# Patient Record
Sex: Female | Born: 1935 | Race: White | Hispanic: No | Smoking: Never smoker
Health system: Southern US, Academic
[De-identification: ages and names within clinical notes are randomized; demographics above are authoritative.]

## PROBLEM LIST (undated history)

## (undated) DIAGNOSIS — I509 Heart failure, unspecified: Secondary | ICD-10-CM

## (undated) DIAGNOSIS — I219 Acute myocardial infarction, unspecified: Secondary | ICD-10-CM

## (undated) DIAGNOSIS — K219 Gastro-esophageal reflux disease without esophagitis: Secondary | ICD-10-CM

## (undated) DIAGNOSIS — E039 Hypothyroidism, unspecified: Secondary | ICD-10-CM

## (undated) DIAGNOSIS — R569 Unspecified convulsions: Secondary | ICD-10-CM

## (undated) DIAGNOSIS — I10 Essential (primary) hypertension: Secondary | ICD-10-CM

## (undated) DIAGNOSIS — J309 Allergic rhinitis, unspecified: Secondary | ICD-10-CM

## (undated) HISTORY — PX: HX TAH AND BSO: SHX83

## (undated) HISTORY — PX: HX THYROID BIOPSY: 2101000001

## (undated) HISTORY — PX: HX COLON SURGERY (ANY): 2100001403

## (undated) HISTORY — PX: HX APPENDECTOMY: SHX54

## (undated) HISTORY — PX: HX KNEE REPLACMENT: SHX125

## (undated) HISTORY — PX: HX HYSTERECTOMY: SHX81

## (undated) HISTORY — PX: HX WISDOM TEETH EXTRACTION: SHX21

---

## 2006-09-06 ENCOUNTER — Ambulatory Visit (INDEPENDENT_AMBULATORY_CARE_PROVIDER_SITE_OTHER): Payer: Self-pay | Admitting: Specialist

## 2006-09-13 ENCOUNTER — Ambulatory Visit (INDEPENDENT_AMBULATORY_CARE_PROVIDER_SITE_OTHER): Payer: Medicare Other | Admitting: Neurology

## 2008-05-28 ENCOUNTER — Other Ambulatory Visit (HOSPITAL_COMMUNITY): Payer: Self-pay

## 2008-06-22 ENCOUNTER — Ambulatory Visit
Admission: RE | Admit: 2008-06-22 | Discharge: 2008-06-22 | Disposition: A | Discharge status: Home / Self Care | Payer: Medicare Other

## 2008-06-22 DIAGNOSIS — Z1231 Encounter for screening mammogram for malignant neoplasm of breast: Secondary | ICD-10-CM | POA: Insufficient documentation

## 2011-04-26 ENCOUNTER — Ambulatory Visit (HOSPITAL_COMMUNITY): Payer: Self-pay | Admitting: Family

## 2014-10-24 ENCOUNTER — Encounter (FREE_STANDING_LABORATORY_FACILITY)
Admit: 2014-10-24 | Disposition: A | Discharge status: Home / Self Care | Payer: Self-pay | Source: Other Acute Inpatient Hospital

## 2014-10-25 ENCOUNTER — Other Ambulatory Visit (FREE_STANDING_LABORATORY_FACILITY): Payer: Self-pay

## 2014-10-25 LAB — MANUAL DIFFERENTIAL (CELLAVISION)
BANDS: 1 % (ref 0–15)
BASOPHIL #: 0 x10ˆ3/uL (ref 0.00–0.20)
BASOPHIL %: 0 %
EOSINOPHIL #: 0 x10ˆ3/uL (ref 0.00–0.50)
EOSINOPHIL %: 0 %
LYMPHOCYTE #: 2.81 x10ˆ3/uL (ref 1.00–4.80)
LYMPHOCYTE %: 27 %
METAMYELOCYTES: 1 % (ref 0–2)
MONOCYTE #: 0.52 x10?3/uL (ref 0.30–1.00)
MONOCYTE %: 5 %
NEUTROPHIL #: 6.87 x10ˆ3/uL (ref 1.50–7.70)
NEUTROPHIL %: 66 %

## 2014-10-25 LAB — URINALYSIS, MACROSCOPIC
BILIRUBIN: NEGATIVE mg/dL
BLOOD: NEGATIVE mg/dL
COLOR: NORMAL
GLUCOSE: NEGATIVE mg/dL
KETONES: NEGATIVE mg/dL
LEUKOCYTES: NEGATIVE WBCs/uL
NITRITE: NEGATIVE
PH: 7 (ref 5.0–8.0)
PROTEIN: NEGATIVE mg/dL
SPECIFIC GRAVITY: 1.01 (ref 1.005–1.030)
UROBILINOGEN: NEGATIVE mg/dL

## 2014-10-25 LAB — URINALYSIS, MICROSCOPIC
RBCS: 0 /HPF (ref <6.0)
WBCS: <1 /HPF (ref <11.0)

## 2014-10-25 LAB — CBC/DIFF - CLIENT CONSOLIDATED
BASOPHIL #: 0 x10ˆ3/uL (ref 0.00–0.20)
BASOPHIL %: 0 %
EOSINOPHIL #: 0 x10ˆ3/uL (ref 0.00–0.50)
EOSINOPHIL %: 0 %
HCT: 27.7 % — ABNORMAL LOW (ref 33.5–45.2)
HGB: 9.5 g/dL — ABNORMAL LOW (ref 11.2–15.2)
LYMPHOCYTE #: 2.81 x10ˆ3/uL (ref 1.00–4.80)
LYMPHOCYTE %: 27 %
MCH: 32.7 pg (ref 27.4–33.0)
MCHC: 34.4 g/dL (ref 32.5–35.8)
MCV: 95.1 fL (ref 78.0–100.0)
MONOCYTE #: 0.52 x10ˆ3/uL (ref 0.30–1.00)
MONOCYTE %: 5 %
MPV: 7.7 fL (ref 7.5–11.5)
NEUTROPHIL #: 6.87 x10ˆ3/uL (ref 1.50–7.70)
NEUTROPHIL %: 66 %
PLATELETS: 213 x10ˆ3/uL (ref 140–450)
RBC: 2.92 x10ˆ6/uL — ABNORMAL LOW (ref 3.63–4.92)
RDW: 13.3 % (ref 12.0–15.0)
WBC: 10.3 x10ˆ3/uL
WBC: 10.3 x10ˆ3/uL (ref 3.5–11.0)

## 2014-10-25 LAB — CBC WITH DIFF
HCT: 27.7 % — ABNORMAL LOW (ref 33.5–45.2)
HGB: 9.5 g/dL — ABNORMAL LOW (ref 11.2–15.2)
MCH: 32.7 pg (ref 27.4–33.0)
MCHC: 34.4 g/dL (ref 32.5–35.8)
MCV: 95.1 fL (ref 78.0–100.0)
MPV: 7.7 fL (ref 7.5–11.5)
PLATELETS: 213 x10ˆ3/uL (ref 140–450)
RBC: 2.92 x10ˆ6/uL — ABNORMAL LOW (ref 3.63–4.92)
RDW: 13.3 % (ref 12.0–15.0)
WBC: 10.3 x10ˆ3/uL (ref 3.5–11.0)

## 2014-10-25 LAB — COMPREHENSIVE METABOLIC PANEL, NON-FASTING
ALBUMIN: 2.7 g/dL — ABNORMAL LOW (ref 3.4–4.8)
ALKALINE PHOSPHATASE: 130 U/L (ref <150)
ALT (SGPT): 385 U/L — ABNORMAL HIGH (ref <55)
ANION GAP: 8 mmol/L (ref 4–13)
AST (SGOT): 253 U/L — ABNORMAL HIGH (ref 8–41)
BILIRUBIN TOTAL: 1.4 mg/dL — ABNORMAL HIGH (ref 0.3–1.3)
BUN/CREA RATIO: 22 (ref 6–22)
BUN: 21 mg/dL (ref 8–25)
CALCIUM: 8.8 mg/dL (ref 8.5–10.4)
CHLORIDE: 98 mmol/L (ref 96–111)
CO2 TOTAL: 29 mmol/L (ref 22–32)
CREATININE: 0.97 mg/dL (ref 0.49–1.10)
ESTIMATED GFR: 59 mL/min/1.73mˆ2 — ABNORMAL LOW (ref >59)
GLUCOSE: 112 mg/dL (ref 65–139)
POTASSIUM: 4.4 mmol/L (ref 3.5–5.1)
PROTEIN TOTAL: 5.5 g/dL — ABNORMAL LOW (ref 6.0–8.0)
SODIUM: 135 mmol/L — ABNORMAL LOW (ref 136–145)

## 2014-10-26 LAB — URINALYSIS, MACROSCOPIC AND MICROSCOPIC W/CULTURE REFLEX - CLIENT CONSOLIDATED
BILIRUBIN: NEGATIVE mg/dL
BLOOD: NEGATIVE mg/dL
COLOR: NORMAL
GLUCOSE: NEGATIVE mg/dL
KETONES: NEGATIVE mg/dL
LEUKOCYTES: NEGATIVE WBCs/uL
NITRITE: NEGATIVE
PH: 7 (ref 5.0–8.0)
PROTEIN: NEGATIVE mg/dL
RBCS: 0 /HPF (ref <6.0)
SPECIFIC GRAVITY: 1.01 (ref 1.005–1.030)
UROBILINOGEN: NEGATIVE mg/dL
WBCS: <1 /HPF (ref <11.0)

## 2014-10-28 ENCOUNTER — Other Ambulatory Visit (FREE_STANDING_LABORATORY_FACILITY): Payer: Self-pay

## 2014-10-28 LAB — CBC/DIFF - CLIENT CONSOLIDATED
BASOPHIL #: 0 x10?3/uL (ref 0.00–0.20)
BASOPHIL %: 0 %
EOSINOPHIL #: 0.14 x10ˆ3/uL (ref 0.00–0.50)
EOSINOPHIL %: 1 %
HCT: 28.6 % — ABNORMAL LOW (ref 33.5–45.2)
HGB: 9.8 g/dL — ABNORMAL LOW (ref 11.2–15.2)
LYMPHOCYTE #: 1.93 x10?3/uL (ref 1.00–4.80)
LYMPHOCYTE %: 14 %
MCH: 33.2 pg — ABNORMAL HIGH (ref 27.4–33.0)
MCHC: 34.2 g/dL (ref 32.5–35.8)
MCV: 97.3 fL (ref 78.0–100.0)
MONOCYTE #: 1.1 x10ˆ3/uL — ABNORMAL HIGH (ref 0.30–1.00)
MONOCYTE %: 8 %
MPV: 7.7 fL (ref 7.5–11.5)
NEUTROPHIL #: 10.49 x10ˆ3/uL — ABNORMAL HIGH (ref 1.50–7.70)
NEUTROPHIL %: 76 %
PLATELETS: 254 x10ˆ3/uL (ref 140–450)
RBC: 2.93 x10ˆ6/uL — ABNORMAL LOW (ref 3.63–4.92)
RDW: 13.8 % (ref 12.0–15.0)
WBC: 13.8 x10ˆ3/uL
WBC: 13.8 x10ˆ3/uL — ABNORMAL HIGH (ref 3.5–11.0)

## 2014-10-28 LAB — CBC WITH DIFF
HCT: 28.6 % — ABNORMAL LOW (ref 33.5–45.2)
HGB: 9.8 g/dL — ABNORMAL LOW (ref 11.2–15.2)
MCH: 33.2 pg — ABNORMAL HIGH (ref 27.4–33.0)
MCHC: 34.2 g/dL (ref 32.5–35.8)
MCV: 97.3 fL (ref 78.0–100.0)
MPV: 7.7 fL (ref 7.5–11.5)
PLATELETS: 254 x10ˆ3/uL (ref 140–450)
RBC: 2.93 x10ˆ6/uL — ABNORMAL LOW (ref 3.63–4.92)
RDW: 13.8 % (ref 12.0–15.0)
WBC: 13.8 x10ˆ3/uL — ABNORMAL HIGH (ref 3.5–11.0)

## 2014-10-28 LAB — MANUAL DIFFERENTIAL (CELLAVISION)
BASOPHIL #: 0 x10ˆ3/uL (ref 0.00–0.20)
BASOPHIL %: 0 %
EOSINOPHIL #: 0.14 x10ˆ3/uL (ref 0.00–0.50)
EOSINOPHIL %: 1 %
LYMPHOCYTE #: 1.93 x10ˆ3/uL (ref 1.00–4.80)
LYMPHOCYTE %: 14 %
METAMYELOCYTES: 1 % (ref 0–2)
MONOCYTE #: 1.1 x10ˆ3/uL — ABNORMAL HIGH (ref 0.30–1.00)
MONOCYTE %: 8 %
NEUTROPHIL #: 10.49 x10ˆ3/uL — ABNORMAL HIGH (ref 1.50–7.70)
NEUTROPHIL %: 76 %
NRBC # AUTOMATED: 1 x10ˆ3/uL

## 2014-10-28 LAB — BASIC METABOLIC PANEL
ANION GAP: 13 mmol/L (ref 4–13)
BUN/CREA RATIO: 22 (ref 6–22)
BUN: 26 mg/dL — ABNORMAL HIGH (ref 8–25)
CALCIUM: 8.7 mg/dL (ref 8.5–10.4)
CHLORIDE: 102 mmol/L (ref 96–111)
CO2 TOTAL: 23 mmol/L (ref 22–32)
CREATININE: 1.18 mg/dL — ABNORMAL HIGH (ref 0.49–1.10)
ESTIMATED GFR: 47 mL/min/1.73mˆ2 — ABNORMAL LOW (ref >59)
GLUCOSE: 109 mg/dL (ref 65–139)
POTASSIUM: 4 mmol/L (ref 3.5–5.1)
SODIUM: 138 mmol/L (ref 136–145)

## 2014-10-29 ENCOUNTER — Other Ambulatory Visit (FREE_STANDING_LABORATORY_FACILITY): Payer: Self-pay | Admitting: PHYSICIAN ASSISTANT

## 2014-10-30 LAB — URINALYSIS, MACROSCOPIC
BILIRUBIN: NEGATIVE mg/dL
COLOR: NORMAL
GLUCOSE: NEGATIVE mg/dL
KETONES: NEGATIVE mg/dL
NITRITE: POSITIVE — AB
PH: 5 (ref 5.0–8.0)
PROTEIN: 30 mg/dL — AB
SPECIFIC GRAVITY: 1.02 (ref 1.005–1.030)
UROBILINOGEN: NEGATIVE mg/dL

## 2014-10-30 LAB — URINALYSIS, MACROSCOPIC AND MICROSCOPIC W/CULTURE REFLEX - CLIENT CONSOLIDATED
BILIRUBIN: NEGATIVE mg/dL
COLOR: NORMAL
KETONES: NEGATIVE mg/dL
NITRITE: POSITIVE — AB
PH: 5 (ref 5.0–8.0)
PROTEIN: 30 mg/dL — AB
RBCS: 26 /HPF — ABNORMAL HIGH (ref <6.0)
SPECIFIC GRAVITY: 1.02 (ref 1.005–1.030)
UROBILINOGEN: NEGATIVE mg/dL
WBCS: >182 /HPF — ABNORMAL HIGH (ref <11.0)

## 2014-10-30 LAB — URINALYSIS, MICROSCOPIC
RBCS: 26 /HPF — ABNORMAL HIGH (ref <6.0)
WBCS: >182 /HPF — ABNORMAL HIGH (ref <11.0)

## 2014-10-31 ENCOUNTER — Other Ambulatory Visit (FREE_STANDING_LABORATORY_FACILITY): Payer: Self-pay | Admitting: PHYSICIAN ASSISTANT

## 2014-10-31 LAB — CBC
HCT: 30.8 % — ABNORMAL LOW (ref 33.5–45.2)
HGB: 10.4 g/dL — ABNORMAL LOW (ref 11.2–15.2)
MCH: 33.3 pg — ABNORMAL HIGH (ref 27.4–33.0)
MCHC: 33.9 g/dL (ref 32.5–35.8)
MCV: 98.1 fL (ref 78.0–100.0)
MPV: 7.6 fL (ref 7.5–11.5)
PLATELETS: 334 x10ˆ3/uL (ref 140–450)
RBC: 3.14 x10ˆ6/uL — ABNORMAL LOW (ref 3.63–4.92)
RDW: 14.7 % (ref 12.0–15.0)
WBC: 9.8 x10ˆ3/uL (ref 3.5–11.0)

## 2014-10-31 LAB — BASIC METABOLIC PANEL
ANION GAP: 13 mmol/L (ref 4–13)
BUN/CREA RATIO: 22 (ref 6–22)
BUN: 22 mg/dL (ref 8–25)
CALCIUM: 8.9 mg/dL (ref 8.5–10.4)
CHLORIDE: 104 mmol/L (ref 96–111)
CO2 TOTAL: 22 mmol/L (ref 22–32)
CREATININE: 1.02 mg/dL (ref 0.49–1.10)
ESTIMATED GFR: 56 mL/min/1.73mˆ2 — ABNORMAL LOW (ref >59)
GLUCOSE: 95 mg/dL (ref 65–139)
POTASSIUM: 4 mmol/L (ref 3.5–5.1)
SODIUM: 139 mmol/L (ref 136–145)

## 2014-11-01 LAB — URINE CULTURE: URINE CULTURE: >100000 — AB

## 2014-11-18 ENCOUNTER — Encounter (FREE_STANDING_LABORATORY_FACILITY)
Admit: 2014-11-18 | Discharge: 2014-11-18 | Disposition: A | Discharge status: Home / Self Care | Payer: Self-pay | Attending: Family | Admitting: Family

## 2014-11-18 ENCOUNTER — Other Ambulatory Visit (FREE_STANDING_LABORATORY_FACILITY): Payer: Self-pay | Admitting: Family

## 2014-11-19 LAB — URINE CULTURE: URINE CULTURE: <10000 — AB

## 2015-02-12 ENCOUNTER — Other Ambulatory Visit (HOSPITAL_COMMUNITY): Payer: Self-pay | Admitting: Family

## 2015-02-12 DIAGNOSIS — I509 Heart failure, unspecified: Secondary | ICD-10-CM

## 2015-02-24 ENCOUNTER — Other Ambulatory Visit (INDEPENDENT_AMBULATORY_CARE_PROVIDER_SITE_OTHER): Payer: Self-pay | Admitting: Internal Medicine

## 2015-02-24 ENCOUNTER — Encounter (INDEPENDENT_AMBULATORY_CARE_PROVIDER_SITE_OTHER): Payer: Self-pay | Admitting: Internal Medicine

## 2015-02-24 ENCOUNTER — Ambulatory Visit (INDEPENDENT_AMBULATORY_CARE_PROVIDER_SITE_OTHER): Payer: MEDICARE | Admitting: Internal Medicine

## 2015-02-24 DIAGNOSIS — R079 Chest pain, unspecified: Secondary | ICD-10-CM

## 2015-02-24 DIAGNOSIS — I509 Heart failure, unspecified: Secondary | ICD-10-CM

## 2015-02-24 MED ORDER — METOPROLOL SUCCINATE ER 50 MG TABLET,EXTENDED RELEASE 24 HR
50.0000 mg | ORAL_TABLET | Freq: Every day | ORAL | 4 refills | Status: DC
Start: 2015-02-24 — End: 2015-03-12

## 2015-02-24 MED ORDER — SPIRONOLACTONE 25 MG TABLET
25.0000 mg | ORAL_TABLET | Freq: Every day | ORAL | 3 refills | Status: DC
Start: 2015-02-24 — End: 2015-03-04

## 2015-02-24 NOTE — H&P (Signed)
[facility] AND [facility]                              DEPARTMENT OF MEDICINE                                [place], [place] [address]                                PATIENT NAME: [name], [name]  HOSPITAL UEAVWU:[id number]  DATE OF SERVICE:02/24/2015  DATE OF BIRTH: April 21, 1933    HISTORY AND PHYSICAL    PRIMARY CARE PROVIDER:  Lamont Snowball, NP    REASON FOR VISIT:  Congestive heart failure.    HISTORY OF PRESENT ILLNESS:  [name] is an 82 year old female without known coronary artery disease who was recently diagnosed with congestive heart failure.  This was based upon a recent transthoracic echocardiogram from February 06, 2015, in which she was found to have global hypokinesis of her left ventricle with estimated ejection fraction of 25% with wall motion abnormalities involving the distal anterior septal, apical and lateral walls.  She reports for approximately the last 2-3 months she has been experiencing worsening dyspnea, particularly with exertion.  She describes shortness of breath, both in conversation and with light exertion such as walking around her house.  Her family has noted heavy breathing at times as when talking to her over the phone, suggestive of New York Heart Association class III-IV symptomatology.  She denies any definite fluid overloaded at this time, however, continues to be short of breath.  She denies any definite chest pain, arm pain, neck pain, or back pain.  She reports no loss of consciousness, syncope or near syncope.  She has no known history of coronary artery disease, but her risk factors include hypertension, hyperlipidemia and a strong family history of coronary artery disease.    MEDICATIONS:  1.  Allopurinol 100 mg daily.  2.  Aspirin 81 mg daily.  3.  Synthroid 50 mcg daily.  4.  Lisinopril 10 mg daily.  5.  Toprol-XL 50 mg daily (this was initiated today).  6.  Aldactone 25 mg daily (this was initiated today).    PAST MEDICAL HISTORY:  1.   Hypertension.  2.  Hyperlipidemia.  3.  Psoriasis.  4.  Early dementia.  5.  History of diverticulitis, status post colon resection.    PAST SURGICAL HISTORY:  She reports a history of parathyroid surgery, left knee surgery, colon resection secondary to diverticulitis, and hysterectomy.    SOCIAL HISTORY:  She is a nonsmoker, does not drink alcohol.  She is a retired former Scientist, research (physical sciences).  She has 1 healthy child.    FAMILY HISTORY:  She reports a family history of coronary artery disease in both her mother and father in their 68s.    REVIEW OF SYSTEMS:  Other than the aforementioned shortness of breath with exertion, she reports negative to all other review of systems other than trouble sleeping at night.    PHYSICAL EXAMINATION:  On exam, she is 5 feet 2 inches tall, she weighs 155 pounds, heart rate 81 beats per minute, blood pressure 157/85, and oxygen saturation 97% on room air.  She is in no acute distress.  Head is normocephalic, atraumatic.  Neck is supple without JVD, carotid bruit or thyromegaly.  Heart is regular rate and rhythm without murmurs, gallops or rubs.  Lungs:  Clear to auscultation bilaterally.  Abdomen:  Soft, nontender, nondistended.  Extremities reveal no peripheral edema.  She is alert and oriented x3.  Skin is warm and dry without rash.    LABORATORY DATA AND TESTS:  An electrocardiogram performed today in clinic revealed a normal sinus rhythm with a left bundle branch block.  This left bundle branch block dates back to least September 2016 when she had a previous EKG at an outside facility and thus is old.  She had a carotid artery ultrasound study done in January 2017 which revealed no evidence of hemodynamically significant stenosis in the carotid arteries, with mild bilateral atherosclerotic plaque formation.    ASSESSMENT AND PLAN:  [name] an 82 year old female who presents for evaluation of congestive heart failure.  1.  Systolic congestive heart failure.  She clearly has New  [place] Heart Association class III-IV symptomatology.  At this time, we recommend further evaluation with a right and left cardiac catheterization to be performed at [facility] in the near future, following insurance authorization.  On physical exam, she appears to be clinically euvolemic.  We will initiate Toprol-XL 50 mg daily as well as Spironolactone 25 mg daily.  She will also continue lisinopril 10 mg daily.  We will follow up with her approximately 4 weeks after her cardiac catheterization. If tolerating, this regimen, post-cath we will likely recommend initiation of Entresto with discontinuation of Lisinopril.  2.  Hypertension.  Blood pressure is elevated.  We have added Aldactone and Toprol-XL to her regimen and will follow up with her.  3.  The patient was given a lab slip for baseline labs to be checked approximately 7-10 days after initiation of Aldactone as well as part of the preoperative workup for her cardiac catheterization.  4.  Follow up in 4-6 weeks after cardiac catheterization.      Earl Lites, MD  Assistant Professor   [facility] Department of Medicine, Section of Cardiology    ZO/[id number]; D: 02/24/2015 12:15:34; T: 02/24/2015 14:31:55    cc: Lamont Snowball NP      [address] [place], [place] [address]

## 2015-02-24 NOTE — Progress Notes (Signed)
See dictated note.  JL

## 2015-02-27 ENCOUNTER — Telehealth (HOSPITAL_BASED_OUTPATIENT_CLINIC_OR_DEPARTMENT_OTHER): Payer: Self-pay | Admitting: Internal Medicine

## 2015-02-27 NOTE — Telephone Encounter (Signed)
Received order for R/L heart cath with Dr. Cleone Slim. Spoke with pt's niece Mickeal Skinner, scheduled procedure, and gave her instructions. Pt to report to first floor registration at [facility] 03/04/15 at 7am, NPO after MN, May take am meds with sip of water, and she will need someone with her to drive her home. Phoebe verbalizes understanding of all instructions given and denies any questions or concerns at this time.  Levan Hurst RN

## 2015-03-04 ENCOUNTER — Ambulatory Visit (HOSPITAL_COMMUNITY): Payer: Medicare Other

## 2015-03-04 ENCOUNTER — Inpatient Hospital Stay
Admission: RE | Admit: 2015-03-04 | Discharge: 2015-03-05 | DRG: 247 | Disposition: A | Discharge status: Home / Self Care | Payer: Medicare Other | Source: Ambulatory Visit | Attending: Cardiovascular Disease | Admitting: Cardiovascular Disease

## 2015-03-04 ENCOUNTER — Encounter (HOSPITAL_COMMUNITY)
Admission: RE | Disposition: A | Discharge status: Home / Self Care | Payer: Self-pay | Source: Ambulatory Visit | Attending: Cardiovascular Disease

## 2015-03-04 ENCOUNTER — Encounter (HOSPITAL_COMMUNITY): Payer: Self-pay

## 2015-03-04 ENCOUNTER — Inpatient Hospital Stay (HOSPITAL_COMMUNITY): Payer: Medicare Other | Admitting: Cardiovascular Disease

## 2015-03-04 ENCOUNTER — Encounter (INDEPENDENT_AMBULATORY_CARE_PROVIDER_SITE_OTHER): Payer: Self-pay | Admitting: Internal Medicine

## 2015-03-04 DIAGNOSIS — I110 Hypertensive heart disease with heart failure: Principal | ICD-10-CM | POA: Diagnosis present

## 2015-03-04 DIAGNOSIS — I255 Ischemic cardiomyopathy: Secondary | ICD-10-CM | POA: Diagnosis present

## 2015-03-04 DIAGNOSIS — L271 Localized skin eruption due to drugs and medicaments taken internally: Secondary | ICD-10-CM | POA: Diagnosis present

## 2015-03-04 DIAGNOSIS — E039 Hypothyroidism, unspecified: Secondary | ICD-10-CM | POA: Diagnosis present

## 2015-03-04 DIAGNOSIS — Z7982 Long term (current) use of aspirin: Secondary | ICD-10-CM

## 2015-03-04 DIAGNOSIS — L409 Psoriasis, unspecified: Secondary | ICD-10-CM | POA: Diagnosis present

## 2015-03-04 DIAGNOSIS — T500X5A Adverse effect of mineralocorticoids and their antagonists, initial encounter: Secondary | ICD-10-CM | POA: Diagnosis present

## 2015-03-04 DIAGNOSIS — I5022 Chronic systolic (congestive) heart failure: Secondary | ICD-10-CM | POA: Diagnosis present

## 2015-03-04 DIAGNOSIS — I2510 Atherosclerotic heart disease of native coronary artery without angina pectoris: Secondary | ICD-10-CM | POA: Diagnosis present

## 2015-03-04 DIAGNOSIS — I361 Nonrheumatic tricuspid (valve) insufficiency: Secondary | ICD-10-CM

## 2015-03-04 DIAGNOSIS — I509 Heart failure, unspecified: Secondary | ICD-10-CM

## 2015-03-04 DIAGNOSIS — Z23 Encounter for immunization: Secondary | ICD-10-CM

## 2015-03-04 DIAGNOSIS — H9190 Unspecified hearing loss, unspecified ear: Secondary | ICD-10-CM | POA: Diagnosis present

## 2015-03-04 DIAGNOSIS — Z8249 Family history of ischemic heart disease and other diseases of the circulatory system: Secondary | ICD-10-CM

## 2015-03-04 DIAGNOSIS — F0390 Unspecified dementia without behavioral disturbance: Secondary | ICD-10-CM | POA: Diagnosis present

## 2015-03-04 DIAGNOSIS — E785 Hyperlipidemia, unspecified: Secondary | ICD-10-CM | POA: Diagnosis present

## 2015-03-04 DIAGNOSIS — I447 Left bundle-branch block, unspecified: Secondary | ICD-10-CM | POA: Diagnosis present

## 2015-03-04 DIAGNOSIS — Z79899 Other long term (current) drug therapy: Secondary | ICD-10-CM

## 2015-03-04 HISTORY — DX: Hypothyroidism, unspecified: E03.9

## 2015-03-04 HISTORY — DX: Unspecified convulsions (CMS HCC): R56.9

## 2015-03-04 HISTORY — DX: Essential (primary) hypertension: I10

## 2015-03-04 HISTORY — DX: Allergic rhinitis, unspecified: J30.9

## 2015-03-04 HISTORY — DX: Heart failure, unspecified (CMS HCC): I50.9

## 2015-03-04 HISTORY — DX: Gastro-esophageal reflux disease without esophagitis: K21.9

## 2015-03-04 HISTORY — DX: Acute myocardial infarction, unspecified (CMS HCC): I21.9

## 2015-03-04 LAB — BASIC METABOLIC PANEL
ANION GAP: 11 mmol/L (ref 4–13)
BUN/CREA RATIO: 21 (ref 6–22)
BUN: 25
BUN: 25 mg/dL (ref 8–25)
CALCIUM: 9.8
CALCIUM: 9.8 mg/dL (ref 8.5–10.4)
CARBON DIOXIDE: 25
CHLORIDE: 103
CHLORIDE: 104 mmol/L (ref 96–111)
CO2 TOTAL: 23 mmol/L (ref 22–32)
CREATININE: 1
CREATININE: 1.18 mg/dL — ABNORMAL HIGH (ref 0.49–1.10)
ESTIMATED GFR: 47 mL/min/1.73mˆ2 — ABNORMAL LOW (ref >59)
ESTIMATED GLOMERULAR FILTRATION RATE: 53
GLUCOSE,NONFAST: 84
GLUCOSE: 104 mg/dL (ref 65–139)
POTASSIUM: 4.4 mmol/L (ref 3.5–5.1)
POTASSIUM: 4.6
SODIUM: 138 mmol/L (ref 136–145)
SODIUM: 143

## 2015-03-04 LAB — CBC WITH DIFF
ABSOLUTE LYMPHOCYTE COUNT: 2.7
ABSOLUTE NEUTROPHIL COUNT: 6.6
BASOPHIL #: 0.13 x10ˆ3/uL (ref 0.00–0.20)
BASOPHIL %: 1 %
BASOPHIL: 0.7
EOSINOPHIL #: 0.45 x10ˆ3/uL (ref 0.00–0.50)
EOSINOPHIL %: 4 %
EOSINOPHIL: 3.1
HCT: 38.4
HCT: 40.6 % (ref 33.5–45.2)
HGB: 13.7
HGB: 14 g/dL (ref 11.2–15.2)
LYMPHOCYTE #: 2.98 x10ˆ3/uL (ref 1.00–4.80)
LYMPHOCYTE #: 25.8 x10ˆ3/uL
LYMPHOCYTE %: 27 %
MCH: 31.5 pg (ref 27.4–33.0)
MCH: 32
MCHC: 34.5 g/dL (ref 32.5–35.8)
MCHC: 36
MCV: 89
MCV: 91.3 fL (ref 78.0–100.0)
MONOCYTE #: 0.93 x10ˆ3/uL (ref 0.30–1.00)
MONOCYTE %: 9 %
MPV: 7.3 fL — ABNORMAL LOW (ref 7.5–11.5)
MPV: 9
NEUTROPHIL #: 6.48 x10ˆ3/uL (ref 1.50–7.70)
NEUTROPHIL %: 59 %
NEUTROPHILS MANUAL: 63
PLATELET COUNT: 263
PLATELETS: 245 x10ˆ3/uL (ref 140–450)
RBC: 4.45 x10ˆ6/uL (ref 3.63–4.92)
RDW: 13.6
RDW: 13.6
RDW: 14.6 % (ref 12.0–15.0)
WBC: 11 x10ˆ3/uL (ref 3.5–11.0)

## 2015-03-04 LAB — POCT ISTAT G3 BLOOD GAS (ARTERIAL - RESULTS)
BASE EXCESS (BEP): -2 mmol/L (ref ?–2.0)
BASE EXCESS (BEP): 0 mmol/L (ref ?–2.0)
HCO3 (HCO3P): 23 mmol/L (ref 22–26)
HCO3 (HCO3P): 25 mmol/L (ref 22–26)
PCO2 (PCO2P): 38 mmHg (ref 35–45)
PCO2 (PCO2P): 43 mmHg (ref 35–45)
PH (PHP): 7.37 (ref 7.35–7.45)
PH (PHP): 7.38 (ref 7.35–7.45)
PO2 (PO2P): 35 mmHg — CL (ref 72–100)
PO2 (PO2P): 67 mmHg — ABNORMAL LOW (ref 72–100)
SO2 (SO2P): 66 % — CL (ref >=80)
SO2 (SO2P): 93 % (ref >=80)
TCO2 (TOC2P): 24 mmol/L (ref 22–32)
TCO2 (TOC2P): 26 mmol/L (ref 22–32)

## 2015-03-04 LAB — POC ACT CELITE (RESULTS)
ACT CELITE, POC: 181 s — ABNORMAL HIGH
ACT CELITE, POC: 248 s — ABNORMAL HIGH
ACT CELITE, POC: 253 s — ABNORMAL HIGH
ACT CELITE, POC: 299 s — ABNORMAL HIGH
ACT CELITE, POC: 275 s — ABNORMAL HIGH
ACT CELITE, POC: 275 s — ABNORMAL HIGH
ACT CELITE, POC: 233 s — ABNORMAL HIGH
ACT CELITE, POC: 214 s — ABNORMAL HIGH
ACT CELITE, POC: 185 s — ABNORMAL HIGH

## 2015-03-04 LAB — TYPE AND SCREEN
ABO/RH(D): B POS
ANTIBODY SCREEN: NEGATIVE

## 2015-03-04 LAB — PT/INR
INR: 0.91 (ref 0.80–1.20)
PROTHROMBIN TIME: 10.3 s (ref 9.0–13.6)

## 2015-03-04 LAB — H & H
HCT: 37.5 % (ref 33.5–45.2)
HGB: 13 g/dL (ref 11.2–15.2)

## 2015-03-04 LAB — PTT (PARTIAL THROMBOPLASTIN TIME): APTT: 25.3 s (ref 25.1–36.5)

## 2015-03-04 SURGERY — R/L HEART CATH W/CORONARY ANGIO W/WO LVG
Laterality: Right

## 2015-03-04 MED ORDER — HEPARIN (PORCINE) 1,000 UNIT/ML INJECTION SOLUTION
Freq: Once | INTRAMUSCULAR | Status: DC | PRN
Start: 2015-03-04 — End: 2015-03-04
  Administered 2015-03-04: 2000 [IU] via INTRAVENOUS
  Administered 2015-03-04: 3000 [IU] via INTRAVENOUS
  Administered 2015-03-04: 2000 [IU] via INTRAVENOUS
  Administered 2015-03-04: 3600 [IU] via INTRAVENOUS
  Administered 2015-03-04: 2500 [IU] via INTRAVENOUS
  Administered 2015-03-04: 2000 [IU] via INTRAVENOUS

## 2015-03-04 MED ORDER — IOPAMIDOL 250 MG IODINE/ML (51 %) INTRAVENOUS SOLUTION
Freq: Once | INTRAVENOUS | Status: DC | PRN
Start: 2015-03-04 — End: 2015-03-04
  Administered 2015-03-04: 200 mL via INTRA_ARTERIAL

## 2015-03-04 MED ORDER — LIDOCAINE HCL 20 MG/ML (2 %) INJECTION SOLUTION
INTRAMUSCULAR | Status: AC
Start: 2015-03-04 — End: 2015-03-04
  Filled 2015-03-04: qty 20

## 2015-03-04 MED ORDER — PERFLUTREN LIPID MICROSPHERES 1.1 MG/ML INTRAVENOUS SUSPENSION
0.30 mL | INTRAVENOUS | Status: DC
Start: 2015-03-04 — End: 2015-03-05

## 2015-03-04 MED ORDER — LISINOPRIL 10 MG TABLET
10.0000 mg | ORAL_TABLET | Freq: Every day | ORAL | Status: DC
Start: 2015-03-05 — End: 2015-03-05
  Administered 2015-03-05: 10 mg via ORAL
  Filled 2015-03-04: qty 1

## 2015-03-04 MED ORDER — HEPARIN (PORCINE) 5,000 UNIT/ML INJECTION SOLUTION
5000.0000 [IU] | Freq: Three times a day (TID) | INTRAMUSCULAR | Status: DC
Start: 2015-03-04 — End: 2015-03-05
  Administered 2015-03-04 (×2): 0 [IU] via SUBCUTANEOUS
  Administered 2015-03-05: 5000 [IU] via SUBCUTANEOUS
  Filled 2015-03-04 (×6): qty 1

## 2015-03-04 MED ORDER — HEPARIN (PORCINE) 1,000 UNIT/ML INJECTION SOLUTION
INTRAMUSCULAR | Status: AC
Start: 2015-03-04 — End: 2015-03-04
  Filled 2015-03-04: qty 10

## 2015-03-04 MED ORDER — VERAPAMIL 2.5 MG/ML INTRAVENOUS SOLUTION
Freq: Once | INTRAVENOUS | Status: DC | PRN
Start: 2015-03-04 — End: 2015-03-04
  Administered 2015-03-04: 10:00:00 5 mg via INTRA_ARTERIAL

## 2015-03-04 MED ORDER — NITROGLYCERIN 12.5 MG IN NS 250 ML
Freq: Once | Status: DC | PRN
Start: 2015-03-04 — End: 2015-03-04
  Administered 2015-03-04: 200 ug via INTRA_ARTERIAL

## 2015-03-04 MED ORDER — MIDAZOLAM 1 MG/ML INJECTION SOLUTION
INTRAMUSCULAR | Status: AC
Start: 2015-03-04 — End: 2015-03-04
  Filled 2015-03-04: qty 4

## 2015-03-04 MED ORDER — VERAPAMIL 2.5 MG/ML INTRAVENOUS SOLUTION
INTRAVENOUS | Status: AC
Start: 2015-03-04 — End: 2015-03-04
  Filled 2015-03-04: qty 2

## 2015-03-04 MED ORDER — CLOPIDOGREL 300 MG TABLET
ORAL_TABLET | ORAL | Status: AC
Start: 2015-03-04 — End: 2015-03-04
  Filled 2015-03-04: qty 2

## 2015-03-04 MED ORDER — LIDOCAINE HCL 20 MG/ML (2 %) INJECTION SOLUTION
Freq: Once | INTRAMUSCULAR | Status: DC | PRN
Start: 2015-03-04 — End: 2015-03-04
  Administered 2015-03-04 (×2): 5 mL via INTRADERMAL

## 2015-03-04 MED ORDER — MIDAZOLAM 1 MG/ML INJECTION SOLUTION
Freq: Once | INTRAMUSCULAR | Status: DC | PRN
Start: 2015-03-04 — End: 2015-03-04
  Administered 2015-03-04 (×6): 1 mg via INTRAVENOUS

## 2015-03-04 MED ORDER — METOPROLOL SUCCINATE ER 50 MG TABLET,EXTENDED RELEASE 24 HR
50.0000 mg | ORAL_TABLET | Freq: Every day | ORAL | Status: DC
Start: 2015-03-05 — End: 2015-03-05
  Administered 2015-03-05: 50 mg via ORAL
  Filled 2015-03-04: qty 1

## 2015-03-04 MED ORDER — FENTANYL (PF) 50 MCG/ML INJECTION SOLUTION
INTRAMUSCULAR | Status: AC
Start: 2015-03-04 — End: 2015-03-04
  Filled 2015-03-04: qty 2

## 2015-03-04 MED ORDER — FENTANYL (PF) 50 MCG/ML INJECTION SOLUTION
Freq: Once | INTRAMUSCULAR | Status: DC | PRN
Start: 2015-03-04 — End: 2015-03-04
  Administered 2015-03-04 (×4): 25 ug via INTRAVENOUS

## 2015-03-04 MED ORDER — LEVOTHYROXINE 50 MCG TABLET
50.00 ug | ORAL_TABLET | Freq: Every day | ORAL | Status: DC
Start: 2015-03-05 — End: 2015-03-05
  Administered 2015-03-05: 50 ug via ORAL
  Filled 2015-03-04 (×2): qty 1

## 2015-03-04 MED ORDER — ALLOPURINOL 100 MG TABLET
100.0000 mg | ORAL_TABLET | Freq: Every day | ORAL | Status: DC
Start: 2015-03-04 — End: 2015-03-05
  Administered 2015-03-04: 0 mg via ORAL
  Administered 2015-03-05: 100 mg via ORAL
  Filled 2015-03-04 (×2): qty 1

## 2015-03-04 MED ORDER — ASPIRIN 81 MG TABLET,DELAYED RELEASE
81.0000 mg | DELAYED_RELEASE_TABLET | Freq: Every day | ORAL | Status: DC
Start: 2015-03-05 — End: 2015-03-05
  Administered 2015-03-05: 81 mg via ORAL
  Filled 2015-03-04: qty 1

## 2015-03-04 MED ORDER — PNEUMOCOCCAL 13-VAL CONJ VACCINE-DIP CRM (PF) 0.5 ML IM SYRINGE
0.5000 mL | INJECTION | Freq: Once | INTRAMUSCULAR | Status: AC
Start: 2015-03-04 — End: 2015-03-04
  Administered 2015-03-04: 0.5 mL via INTRAMUSCULAR
  Filled 2015-03-04: qty 0.5

## 2015-03-04 MED ORDER — MIDAZOLAM 1 MG/ML INJECTION SOLUTION
INTRAMUSCULAR | Status: AC
Start: 2015-03-04 — End: 2015-03-04
  Filled 2015-03-04: qty 2

## 2015-03-04 MED ORDER — ATORVASTATIN 80 MG TABLET
80.00 mg | ORAL_TABLET | Freq: Every evening | ORAL | Status: DC
Start: 2015-03-04 — End: 2015-03-05
  Administered 2015-03-04: 0 mg via ORAL
  Filled 2015-03-04 (×2): qty 1

## 2015-03-04 MED ORDER — SODIUM CHLORIDE 0.9 % INTRAVENOUS SOLUTION
INTRAVENOUS | Status: DC
Start: 2015-03-04 — End: 2015-03-05

## 2015-03-04 MED ORDER — CLOPIDOGREL 300 MG TABLET
ORAL_TABLET | Freq: Once | ORAL | Status: DC | PRN
Start: 2015-03-04 — End: 2015-03-04
  Administered 2015-03-04: 600 mg via ORAL

## 2015-03-04 MED ADMIN — sodium chloride 0.9 % intravenous solution: INTRA_ARTERIAL | @ 12:00:00 | NDC 00338004904

## 2015-03-04 MED ADMIN — sennosides 8.6 mg-docusate sodium 50 mg tablet: INTRAVENOUS | @ 10:00:00

## 2015-03-04 MED ADMIN — CEFEPIME IVPB - EXTENDED INFUSION: INTRAVENOUS | @ 11:00:00

## 2015-03-04 MED ADMIN — cholecalciferol (vitamin D3) 250 mcg (10,000 unit) capsule: INTRAVENOUS | @ 12:00:00

## 2015-03-04 SURGICAL SUPPLY — 37 items
CATH ANGIO 5FR 145D PGTL CURVE 110CM EXPO FULL LGTH WRE BRD RBST SHAFT PERI CARDIAC TRILON (DIAGNOSTIC) ×3 IMPLANT
CATH ANGIO 5FR 145D PGTL CURVE_110CM EXPO FULL LGTH WRE BRD (DIAGNOSTIC) ×1
CATH ANGIO 5FR RADIAL TIG 4 CURVE 100CM OPTITORQUE LRG LUM SH 2 BRD SFT TIP COR SS NYL POLYUR (CARDIAC) ×3 IMPLANT
CATH ANGIO 5FR RADL TIG 4 CURV_E 100CM OPTITORQUE LRG LUM SH (CARDIAC) ×1
CATH ART LN SWANGANZ 7FR 110CM_STD 4 LUM TD VOL (Central Venous Lines) ×2
CATH ASCULPT EVO 2.5MM 10MM RX BAL DIL HDRPH STRL LF  PTCA (BALLOON) ×3 IMPLANT
CATH ASCULPT NTNL 2.5MM 10MM_137CM RX TPR TIP BAL DIL ACPT (BALLOON) ×1
CATH EMERGE MNRL WH 2MM 15MM 144CM 2 LUM ULTRA LOW PROF RADOPQ BAL DIL OPTILEAP X ZGLIDE ACPT 6FR (BALLOON) ×3 IMPLANT
CATH EMERGE MNRL WH 2MM 15MM 1_44CM 2 LUM ULTRA LO PRFL (BALLOON) ×1
CATH EUPHORA 1.5MM 15MM 142CM RX TAPER SHAFT LOW PROF RADOPQ BAL DIL ULTRA-SLIM DURA-TRC LF  PTCA (BALLOON) ×3 IMPLANT
CATH EUPHORA 1.5MM 15MM 142CM_RX TPR SHFT LO PRFL RADOPQ (BALLOON) ×2
CATH GD 6FR 100CM EBU3.5 CURVE LNCHR LRG LUM RADOPQ FLXB DIST SEG INNER JKT COR NYL PLMR STRL LF (GUIDING) ×3 IMPLANT
CATH GD 6FR 100CM EBU3.5 CURVE_LNCHR LRG LUM RADOPQ FLXB (GUIDING) ×2
CATH NC EUPHORA 3MM 15MM 142CM RX LOW PROF RADOPQ BAL DIL DURA-TRC NYL LF  PTCA (BALLOON) ×3 IMPLANT
CATH NC EUPHORA 3MM 15MM 142CM_RX LO PRFL RADOPQ BAL DIL (BALLOON) ×1
CATH PA 7FR 110CM SWANGANZ 4 LUM TD STD STRL LTX DISP (Central Venous Lines) ×3 IMPLANT
DEVICE COMPRESS TR BAND 24CM 2 BAL TRNSPR ADJ STRAP REG RADIAL ART (BALLOON) ×3 IMPLANT
DEVICE COMPRESS TR BAND 24CM 2_BAL TRNSPR ADJ STRAP AIR TRTN (BALLOON) ×1
DEVICE INFLAT GATEWAY ENCR ADV 3MM 20MM KIT TORQUE GW INTROD 20ML (KITS & TRAYS (DISPOSABLE)) ×6 IMPLANT
DEVICE INFLAT GATEWAY ENCR ADV_3MM 20MM KIT TRQ GW INTROD 20 (KITS & TRAYS (DISPOSABLE)) ×2
GW .038IN 3MM 260CM ANGIO STD TAPER COR J STRL LF  DISP (WIRE) ×3 IMPLANT
GW .038IN 6CM 260CM STD TIP FI_COR EXCH WRE PTFE VAS 3MM (WIRE) ×1
GW RUNTHROUGH .36MM 3CM 180CM RADOPQ X FLPY VAS STRL LF  DISP PTCA (GUIDING) ×3 IMPLANT
GW RUNTHROUGH NS .014IN 3CM 18_CM ATRM X FLPY TIP RADOPQ (GUIDING) ×1
GW RUNTHROUGH NS .014IN 3CM 300CM ATRAUMA X FLPY STR TIP RADOPQ NITINOL HDRPH VAS COR (WIRE) ×3 IMPLANT
GW RUNTHROUGH NS .014IN 3CM 300CM FLXB STR TIP RADOPQ HYPERCOAT NITINOL VAS COR (WIRE) ×6 IMPLANT
GW RUNTHROUGH NS .014IN 3CM 30_CM ATRM X FLPY TIP RADOPQ (WIRE) ×1
GW RUNTHROUGH NS .014IN 3CM 30_CM FLX TP RADOPQ JKT (WIRE) ×2
KIT 5 PORT MANIFOLD K0912454_15/BX (MISCELLANEOUS PT CARE ITEMS) ×4 IMPLANT
KIT ANGIO NAMIC MTS LHK STRL LF  DISP RNLD MEM HOSPITAL (MISCELLANEOUS PT CARE ITEMS) ×3 IMPLANT
KIT INTROD 10CM 6FR 22GA GLIDESHEATH SLNDR .021IN PLASTIC SHEATH DIL 2 WL PNCT SHORT ANG MINIWIRE 45 (GUIDING) ×3 IMPLANT
KIT INTROD 10CM 6FR 22GA GLIDE_SHEATH SLNDR .021IN PLASTIC (GUIDING) ×1
KIT INTROD 10CM 7FR 22GA GLIDESHEATH SLNDR .021IN 25MM SHORT ANG PLASTIC HDRPH SHEATH DIL 2 WL PNCT (GUIDING) ×3 IMPLANT
KIT INTROD 10CM 7FR 22GA GLIDE_SHEATH SLNDR .021IN 25MM SHORT (GUIDING) ×1
MANIFOLD CARIDAC 4V DYE SET (MISCELLANEOUS PT CARE ITEMS) ×1
SHEATH INTROD 10CM 7FR PINN .035IN PERI SS SNAPON DIL LOCK KINK RST SMOOTH TRNS SPRG COIL GW 2.5CM (GUIDING) ×3 IMPLANT
SHEATH INTROD PINNACLE 7FRX10_10/BX RSS701 (GUIDING) ×1

## 2015-03-04 NOTE — H&P (Addendum)
[facility]                                                     H&P Update Form    [name], [name], 82 y.o. female  Date of Admission:  03/04/2015  Date of Birth:  Jul 13, 1933    03/04/2015    STOP: IF H&P IS GREATER THAN 30 DAYS FROM SURGICAL DAY COMPLETE NEW H&P IS REQUIRED.     H & P updated the day of the procedure.  1.  H&P completed within 30 days of surgical procedure by Dr. Cleone Slim on 02/24/15 and has been reviewed within 24 hours of the surgery, the patient has been examined, and no change has occured in the patients condition since the H&P was completed.       Change in medications: No      Last Menstrual Period: Not applicable      Comments:     2.  Patient continues to be appropiate candidate for planned surgical procedure. YES      Antonietta Jewel, MD    Fellow, Section of Cardiovascular Medicine  [facility]          I saw and examined the patient.  I reviewed the resident's note.  I agree with the findings and plan of care as documented in the resident's note.  Any exceptions/additions are edited/noted.    Lucilla Edin, MD    Lucilla Edin, MD

## 2015-03-04 NOTE — Nurses Notes (Signed)
R brachial sheath removed at this time. VS obtained Q3 min while holding pressure. No signs of bleeding or hematoma present. Will continue to monitor.

## 2015-03-04 NOTE — Nurses Notes (Signed)
Dr. Lovenia Shuck informed for creatinine of 1.18.

## 2015-03-04 NOTE — Discharge Instructions (Signed)
Discharge Instructions after Your Cardiac Catheterization with Radial Approach      Activity  For the next 24 hours  . You may feel sleepy.  Do not drive or operate machinery or power tools.  (You must have someone take you home)  . Do not drink any alcoholic beverages  . Do not make any important decisions or sign any legal documents  . Avoid any pushing or pulling motions with affected wrist for 3 days  . Do not lift with the affected arm for 48 hours  . Follow your doctors instructions about returning to work      Care of Wound  . Keep the dressing in place for 24 hours.  You may remove the dressing and shower after 24 hours.  . Cleanse the site gently with soap and water. Pat area dry. No lotions or creams.  Leave the site open to air.  . Do not get your wrist saturated for 5-7 days or until the wound is healed.      Diet  . You may resume your normal diet.  . To help eliminate contrast material from your body, we encourage you to increase your fluid intake   If you are taking Metformin (Glucophage) hold this medication for 3 days and then resume as prescribed.    Call your doctor if you have:  . If bleeding or a hard bruise develops under the skin, apply manual pressure and call 911 immediately.  . Signs that you may be bleeding are: tightness, stinging, pain, warmness, wetness where the puncture site was put in your wrist.    . If bleeding occurs, lie down and apply firm pressure about 2 fingers above the site.  Call 911 and keep pressure on the site.  . Severe Pain, numbness, tingling, change in color or coldness of the arm used for puncture site.   . Temperature greater than 101 degrees F.  . Redness around the site.  . Drainage from the site.

## 2015-03-04 NOTE — Progress Notes (Addendum)
[facility]  Cardiology   Progress Note      [name],[name]  Date of Admission:  03/04/2015  Date of service: 03/04/2015  Date of Birth:  Dec 07, 1933  MRN:  [id number]    Hospital Day:  LOS: 0 days   Subjective: Patient is resting in cath lab holding area.  Patient is hazy and unable to give a history.  Per patient family, she has new onset of shortness of breath and a reduced EF.  Heart catheterization was elective and patient has no prior history of CAD.  Patient received stents to the LAD X 2 and Diagonal branch X 1. IV fluids are in place. Patient is resting and on the monitor, all vital signs are stable and she is in no distress.    Objective:  Patient is resting in bed with no complaints or concerns at this time.  Head is normocephalic, pupils are equal, round, react to light and accommodate.  Sclera is non-icteric.  Conjunctivae are pink.  Oral mucosa is pink and moist.  Neck is supple with full range of motion, no carotid bruits, thyromegaly, or jugular vein distention.  Cardiac exam demonstrates a regular heart rate and rhythm.  S1 and S2 heart sounds are audible with no murmur, rub, or gallop.  Abdomen is soft, non-tender, obese, and bowel sounds are active in all 4 quadrants.  Patient right radial insertion site has TR band in place with hemostasis. Respiratory effort is normal and lungs are clear Lower extremities are well perfused with 2+ palpable pulses and no edema.      Vital Signs:  Temp (24hrs) Max:36.3 C (97.3 F)      Temperature: 36.3 C (97.3 F) (03/04/15 0649)  BP (Non-Invasive): 102/66 (03/04/15 1315)  MAP (Non-Invasive): 72 mmHG (03/04/15 1315)  Heart Rate: 64 (03/04/15 1315)  Respiratory Rate: 16 (03/04/15 1300)  Pain Score (Numeric, Faces): 0 (03/04/15 1300)  SpO2-1: 94 % (03/04/15 1315)    Labs:  All laboratory data was reviewed  Creatinine:  1.18-IV hydration in place    Diagnostics:    EKG:  No study available  EF:  15-20%  CCL:  DES to LAD X 2 and Diagonal X 1    Current  Medications:    Current Facility-Administered Medications:  atorvastatin (LIPITOR) tablet 80 mg Oral QPM   NS premix infusion  Intravenous Continuous       Allergies   Allergen Reactions    Aldactone [Spironolactone] Rash       Assessment/ Plan:  1.  Ischemic Cardiomyopathy  -Patient has an EF of 15-20%  -Patient had no prior CAD  -Patient NYHA Class III-IV  -Patient allergy to aldactone  -Will consider lasix at discharge  -BNP for am  -Continue Toprol and Lisinopril  -Repeat TTE post cardiac catheterization  -Possible Life Vest    2.  Coronary Artery Disease  -Patient has no prior HX of CAD  -New onset SOB  -EF 15-20%  -PCI and DES to LAD X 2 and Diagonal Branch X 1  -ASA and Plavix in CCL  -Continue Toprol and Lisinopril  -Will consider Lasix at discharge  -TTE pending    3.  Hypothyroidism  -TSH ordered for am  -Levothyroxine 50 mcg ordered    4.  Hyperlipidemia  -Lipid panel unavailable  -Lipids for am  -LFT's elevated at this time    5.  Hypertension  -BP within normal limits  -Continue Toprol and Lisinopril    ASA:  Yes  Beta Blocker:  Yes  ACEI/ARB:  Yes  Statin:  No:  Elevated liver enzymes  Antiplatelets (Plavix, Brilinta, Effient): Yes  Spironolactone Indicated (Heart Failure): No:  allergic to it/can't tolerate it  DVT/PE Prophylaxis: Heparin     Disposition Planning: Home    Jory Sims, APRN    I agree with the findings and assessment in the APP's note.  Any exceptions have been noted.    Blenda Mounts, MD, [facility], [facility]  Assistant Professor of Medicine, Section of Cardiology  [facility]

## 2015-03-04 NOTE — Nurses Notes (Signed)
ACT is now at 185. Echo tech at bedside to complete echo. Will pull brachial line once echo is completed. Will continue to monitor.

## 2015-03-04 NOTE — Nurses Notes (Signed)
Patient received from Cardiac Catheterization lab via bed. Supine. Placed on ECG monitor, Pulse Oximetry and NIBP. Side rails up x 3. Bed brake on. Bed in lowest position. Family to bedside.

## 2015-03-04 NOTE — Nurses Notes (Signed)
Patient ambulated back to CCC. Offered restroom. Changed into gown. Instructed on use of call bell and television remote. Vitals signs obtained. Attempting IV access and lab collection at this time. Awaiting healthcare provider to consent patient. Will prep accordingly.Will continue to monitor.

## 2015-03-04 NOTE — Progress Notes (Addendum)
[facility]  Right and Left Heart Catheterization Brief  Note    Name:  [name]  MRN:  [id number]  Date of service:  03/04/2015    Access:  Right Radial artery and Right Basilic vein  Complications:  None    Findings:    Right dominant Coronaries.  95% proximal Diagonal disease s/p SYNERGY RX 963 2.5X16    Severe disease in the mid LAD: 80% and 95% lesions before and after take off of a large caliber diagonal s/p SYNERGY RX 963 2.75X32.  Severe focal disease in the distal mid LAD (95%) s/p SYNERGY RX 963 2.5X38   Mild to moderate disease in the OM3  Normal RCA  Normal Right heart pressures.    Recommendations:    ASA + Plavix preferably for 1 yr  D/c Aldactone as she c/o facial rash  Admit to Cardiology 2 service for overnight observation.  F/U with Dr. Cleone Slim in 4 weeks    Full note to follow.    Antonietta Jewel, MD, 03/04/2015, 12:55  Fellow, Cardiovascular disease  [facility]        Late entry for 03/04/15. I saw and examined the patient.  I reviewed the resident's note.  I agree with the findings and plan of care as documented in the resident's note.  Any exceptions/additions are edited/noted.    Lucilla Edin, MD

## 2015-03-04 NOTE — Nurses Notes (Signed)
Dr. Lovenia Shuck informed for swelling above right radial TR band. Dr. Lovenia Shuck to bedside and hematoma marked.

## 2015-03-04 NOTE — Nurses Notes (Signed)
Patient arrived to room 9SE bed 33. Oriented to room and hospital routines. Verbalizes understanding. R brachial line and R TR band remain in place. R wrist has a large bruised area under TR band. Denies numbness and tingling in hands. Cap refill is >3 seconds. +1 pulse distally. Will continue to monitor.

## 2015-03-05 LAB — ECG 12-LEAD
Atrial Rate: 76 {beats}/min
Calculated P Axis: -5 degrees
Calculated R Axis: 15 degrees
Calculated T Axis: 127 degrees
PR Interval: 194 ms
QRS Duration: 142 ms
QT Interval: 406 ms
QTC Calculation: 456 ms
Ventricular rate: 76 {beats}/min

## 2015-03-05 LAB — CBC
HCT: 37 % (ref 33.5–45.2)
HGB: 12.5 g/dL (ref 11.2–15.2)
MCH: 31.1 pg (ref 27.4–33.0)
MCHC: 33.8 g/dL (ref 32.5–35.8)
MCV: 92 fL (ref 78.0–100.0)
MPV: 7.2 fL — ABNORMAL LOW (ref 7.5–11.5)
PLATELETS: 212 x10ˆ3/uL (ref 140–450)
RBC: 4.03 x10?6/uL (ref 3.63–4.92)
RDW: 15 % (ref 12.0–15.0)
WBC: 12.1 x10ˆ3/uL — ABNORMAL HIGH (ref 3.5–11.0)

## 2015-03-05 LAB — LIPID PANEL
CHOL/HDL RATIO: 4.2
CHOLESTEROL: 184 mg/dL (ref <200)
HDL CHOL: 44 mg/dL — ABNORMAL LOW (ref >=49)
LDL CALC: 102 mg/dL — ABNORMAL HIGH (ref <=100)
NON-HDL: 140 mg/dL (ref <=190)
NON-HDL: 140 mg/dL (ref <=190)
TRIGLYCERIDES: 188 mg/dL — ABNORMAL HIGH (ref <=150)
VLDL CALC: 38 mg/dL — ABNORMAL HIGH (ref <30)

## 2015-03-05 LAB — BASIC METABOLIC PANEL
ANION GAP: 11 mmol/L (ref 4–13)
BUN/CREA RATIO: 16 (ref 6–22)
BUN: 19 mg/dL (ref 8–25)
CALCIUM: 9.1 mg/dL (ref 8.5–10.4)
CHLORIDE: 103 mmol/L (ref 96–111)
CO2 TOTAL: 23 mmol/L (ref 22–32)
CREATININE: 1.21 mg/dL — ABNORMAL HIGH (ref 0.49–1.10)
ESTIMATED GFR: 46 mL/min/1.73mˆ2 — ABNORMAL LOW (ref >59)
GLUCOSE: 147 mg/dL — ABNORMAL HIGH (ref 65–139)
POTASSIUM: 4.1 mmol/L (ref 3.5–5.1)
SODIUM: 137 mmol/L (ref 136–145)

## 2015-03-05 LAB — THYROID STIMULATING HORMONE (SENSITIVE TSH): TSH: 1.264 u[IU]/mL (ref 0.350–5.000)

## 2015-03-05 MED ORDER — ATORVASTATIN 80 MG TABLET
80.00 mg | ORAL_TABLET | Freq: Every evening | ORAL | 5 refills | Status: DC
Start: 2015-03-05 — End: 2015-03-05

## 2015-03-05 MED ORDER — ATORVASTATIN 80 MG TABLET
80.00 mg | ORAL_TABLET | Freq: Every evening | ORAL | 11 refills | Status: DC
Start: 2015-03-05 — End: 2015-03-05

## 2015-03-05 MED ORDER — CLOPIDOGREL 75 MG TABLET
75.0000 mg | ORAL_TABLET | Freq: Every day | ORAL | 11 refills | Status: DC
Start: 2015-03-05 — End: 2015-03-05

## 2015-03-05 MED ORDER — ATORVASTATIN 80 MG TABLET
80.00 mg | ORAL_TABLET | Freq: Every evening | ORAL | 5 refills | Status: DC
Start: 2015-03-05 — End: 2015-06-25

## 2015-03-05 MED ORDER — CLOPIDOGREL 75 MG TABLET
75.0000 mg | ORAL_TABLET | Freq: Every day | ORAL | 11 refills | Status: DC
Start: 2015-03-05 — End: 2016-03-31

## 2015-03-05 MED ADMIN — heparin (porcine) 5,000 unit/mL injection solution: SUBCUTANEOUS | @ 07:00:00

## 2015-03-05 NOTE — Care Management Notes (Signed)
[name]    Patient Name: [name]  Date of Birth: [birth year]  Sex: female  Date/Time of Admission: 03/04/2015  6:32 AM  Room/Bed: 33/A  Payor: MEDICARE / Plan: MEDICARE PART A AND B / Product Type: Medicare /   PCP: [name] Shaggy, NP    Pharmacy Info:   Preferred Pharmacy     [id number] [place] [place] - [address]   [address] [place] [address]   Phone: [phone]: [phone]   Not a 24 hour pharmacy; exact hours not known        Emergency Contact Info:   Extended Emergency Contact Information  Primary Emergency Contact: MRoderic [name] Address[address] Phone: [phone] Relation: Daughter    History:   NChrystine Froggeis a 82y.o., female, admitted with CAD in native artery.     Height/Weight: 157.5 cm ('5\' 2"'$ ) / 73.6 kg (162 lb 4.1 oz)     LOS: 1 day   Admitting Diagnosis: CAD in native artery [I25.10]  CAD in native artery [I25.10]    Assessment:      03/05/15 1100   Assessment Details   Assessment Type Admission   Date of Care Management Update 03/05/15   Date of Next DCP Update 03/07/15   Care Management Plan   Discharge Planning Status initial meeting   Projected Discharge Date 03/05/15   CM will evaluate for rehabilitation potential yes   Patient choice offered to patient/family no   Form for patient choice reviewed/signed and on chart no   Patient aware of possible cost for ambulance transport?  No   Discharge Needs Assessment   Equipment Currently Used at Home walker, rolling;cane, straight   Equipment Needed After Discharge other (see comments)  (TBD)   Discharge Facility/Level Of Care Needs Home (Patient/Family Member/other)(code 1)   Transportation Available car;family or friend will provide   Referral Information   Admission Type inpatient   Address Verified verified-no changes   Arrived From home or self-care   Insurance  Verified verified-no change   ADVANCE DIRECTIVES   Does the Patient have an Advance Directive? No, Information Offered and Refused   Patient Requests Assistance in Having Advance Directive Notarized. N/A   Employment/Financial   Patient has Prescription Coverage?  Yes       Name of Insurance Coverage for Medications BCBS   Financial Concerns none   Living Environment   Select an age group to open "lives with" row.  Adult   Lives With child(ren), adult   Living Arrangements house   Able to Return to Prior Living Arrangements yes   Home Safety   Home Assessment: No Problems Identified   Home Accessibility no concerns;stairs to enter home   Legal Issues   Do you have a court appointed guardian/conservator? No   Living Environment   Number of Stairs to Enter Home 2     Pt admitted with CAD in native artery. Pt had elective cardiac cath with stents placed. Will follow.     Discharge Plan:  Home (Patient/Family Member/other) (code 1)  MSW met with Pt at bedside. Pt lives with daughter, PLovell [name] PLovell Sheehanwill provide transportation home at discharge. No home health noted. Pt uses walker and cane. Pt refused MPOA. Anticipated discharge to home  today.     The patient will continue to be evaluated for developing discharge needs.     Case Manager: [name], [place]  Phone: [phone]

## 2015-03-05 NOTE — Discharge Summary (Addendum)
DISCHARGE SUMMARY      PATIENT NAMEIzzabelle, [name]  MRN:  [id number]  DOB:  January 27, 1933    ADMISSION DATE:  03/04/2015  DISCHARGE DATE:  03/05/2015    ATTENDING PHYSICIAN: Blenda Mounts, MD  SERVICE: Cardiology 2  PRIMARY CARE PHYSICIAN: Lamont Snowball, NP     Reason for Admission     Diagnosis    CAD in native artery [[id number]]    CAD in native artery [[id number]]          DISCHARGE DIAGNOSIS:   Principle Problem: <principal problem not specified>  Active Hospital Problems    Diagnosis Date Noted    CAD in native artery 03/04/2015      Resolved Hospital Problems    Diagnosis    No resolved problems to display.     There are no active non-hospital problems to display for this patient.     Allergies   Allergen Reactions    Aldactone [Spironolactone] Rash       DISCHARGE MEDICATIONS:     Current Discharge Medication List      START taking these medications.       Details    atorvastatin 80 mg Tablet   Commonly known as:  LIPITOR    80 mg, Oral, QPM   Qty:  1 Tab   Refills:  11       clopidogrel 75 mg Tablet   Commonly known as:  PLAVIX    75 mg, Oral, Daily   Qty:  1 Tab   Refills:  11         CONTINUE these medications - NO CHANGES were made during your visit.       Details    allopurinol 100 mg Tablet   Commonly known as:  ZYLOPRIM    100 mg, Oral, Daily   Refills:  0       aspirin 81 mg Tablet, Delayed Release (E.C.)   Commonly known as:  ECOTRIN    81 mg, Oral, Daily   Refills:  0       CENTRUM SILVER ORAL    Oral   Refills:  0       FISH OIL 1,000 mg (120 mg-180 mg) Capsule   Generic drug:  Omega-3-DHA-EPA-Fish Oil    1,200 Caps, Oral   Refills:  0       Levothyroxine 100 mcg Capsule    50 mcg, Oral, Takes daily    Refills:  0       lisinopril 10 mg Tablet   Commonly known as:  PRINIVIL    10 mg, Oral, Daily   Refills:  0       metoprolol succinate 50 mg Tablet Sustained Release 24 hr   Commonly known as:  TOPROL-XL    50 mg, Oral, Daily   Qty:  90 Tab   Refills:  4           Discharge med list refreshed?   YES    During this hospitalization did the patient have an AMI, PCI/PCTA, STENT or Isolated CABG?  Yes  and is being discharged on the following regimen                ASA:  Yes  Beta Blocker:  Yes  ACEI/ARB:  Yes  Statin:  Yes  Antiplatelets (Plavix, Brilinta, Effient): Yes  Spironolactone Indicated (Heart Failure): No:  Other allergy-facial rash  DISCHARGE INSTRUCTIONS:  Follow-up Information     Follow up with Lamont Snowball, NP In 1 week.    Specialty:  EXTERNAL    Why:  Hospital Discharge    Contact information:    [address] [place] [address]  [phone]            BASIC METABOLIC PANEL     DISCHARGE INSTRUCTION - DIET   Diet: CARDIAC DIET      DISCHARGE INSTRUCTION - ACTIVITY   Activity: NO LIFTING OVER 10 POUNDS FOR 1 WEEK    Activity: MAY SHOWER    Activity: NO TUB BATHS      DISCHARGE INSTRUCTION - INCISION/WOUND CARE   Instructions for incision/wound care: Change Dressing as instructed by nurse      SCHEDULE FOLLOW-UP CARDIOLOGY - [place] - [facility]   Follow-up in: 4 WEEKS    Reason for visit: HOSPITAL DISCHARGE    Followup reason: s/p PCI    Provider: Earl Lites MD      SCHEDULE FOLLOW-UP CARDIOLOGY - [facility]   Follow-up in: 1 WEEK    Reason for visit: HOSPITAL DISCHARGE    Followup reason: Hospital Discharge S/P Stent Placement    Provider: Earl Lites MD           REASON FOR HOSPITALIZATION AND HOSPITAL COURSE:    [name] is an 82 year old female without known coronary artery disease who was recently diagnosed with congestive heart failure. This was based upon a recent transthoracic echocardiogram from February 06, 2015, in which she was found to have global hypokinesis of her left ventricle with estimated ejection fraction of 25% with wall motion abnormalities involving the distal anterior septal, apical and lateral walls. She reports for approximately the last 2-3 months she has been experiencing worsening dyspnea, particularly with exertion. She  describes shortness of breath, both in conversation and with light exertion such as walking around her house. Her family has noted heavy breathing at times as when talking to her over the phone, suggestive of New York Heart Association class III-IV symptomatology. She denies any definite fluid overloaded at this time, however, continues to be short of breath. She denies any definite chest pain, arm pain, neck pain, or back pain. She reports no loss of consciousness, syncope or near syncope. She has no known history of coronary artery disease, but her risk factors include hypertension, hyperlipidemia and a strong family history of coronary artery disease    Patient went to cath lab 03/01/2015 where she had PCI and DES placed to the LAD X 2 and Diagonal Branch X 1.  ASA and Plavix started.  Aldactone discontinued due to facial rash.  Patient right radial insertion site was ecchymotic post operative however, hemostasis remained.  Patient was hydrated after procedure.  Creatinine was 1.21, baseline was 1.18.  Patient will repeat BMP 03/07/2015.  Patient also follows with nephrology.  Repeat TTE showed EF recovery of 55-60%.  Appropriate medical therapy is in place.  Patient will follow with Dr Cleone Slim in [place] in 1 week.      CONDITION ON DISCHARGE:  A. Ambulation: Full ambulation  B. Self-care Ability: Complete  C. Cognitive Status Alert and Oriented x 3  D. DNR status at discharge: Full Code    Advance Directive Information       Most Recent Value    Does the Patient have an Advance Directive? Yes, Patient Does Have Advance Directive for Healthcare Treatment    Type of Advance Directive  Completed Medical Power of Attorney, Medical Living Will    Copy of Advance Directives in Chart? No, Copy Requested From Patient    Name of MPOA or Healthcare Surrogate Pheobe- daughter    Phone Number of MPOA or Healthcare Surrogate [phone]          DISCHARGE DISPOSITION:  Home discharge    Jory Sims, APRN    I have seen  and evaluated the patient with the APP and agree with the findings and assessment in the APP's note.  Any exceptions have been noted.    Blenda Mounts, MD, [facility], [facility]  Assistant Professor of Medicine, Section of Cardiology  [facility]        Copies sent to Care Team       Relationship Specialty Notifications Start End    Lamont Snowball, NP PCP - General EXTERNAL  02/18/15     Phone: [phone] Fax: [phone]         [address] [place] [address]          Referring providers can utilize [email or web address] to access their referred [employer] patient's information.

## 2015-03-05 NOTE — Nurses Notes (Signed)
Patient given discharge instructions as directed by MD and handouts on all new medications. Patient and family member verbalize understanding of all discharge instructions and follow up appointments. No questions at this time from patient. PIV removed, pt tolerated well. Patient stable at time of discharge. Accompanied by daughter.

## 2015-03-05 NOTE — Progress Notes (Addendum)
[facility]  Cardiology   Progress Note      [name],[name]  Date of Admission:  03/04/2015  Date of service: 03/05/2015  Date of Birth:  1933-02-28  MRN:  [id number]    Hospital Day:  LOS: 0 days   Subjective: Patient is resting on side of bed with complaints of pain due to ecchymosis to bilateral arms from radial insertion site and frequency of phlebotomy.  Patient is extremely hard of hearing.  Permission was given by patient to call her daughter who is an Charity fundraiser here for update.      Objective: Patient is resting on side of bed in no acute distress.  Head is normocephalic, pupils are equal, round, react to light and accommodate. Sclera is non-icteric. Conjunctivae are pink. Oral mucosa is pink and moist. Neck is supple with full range of motion, no carotid bruits, thyromegaly, or jugular vein distention. Cardiac exam demonstrates a regular heart rate and rhythm. S1 and S2 heart sounds are audible with no murmur, rub, or gallop. Abdomen is soft, non-tender, obese, and bowel sounds are active in all 4 quadrants. Patient right radial insertion site is severely ecchymotic with hemostasis and no signs of infection.  Respiratory effort is normal and lungs are clear Lower extremities are well perfused with 2+ palpable pulses and no edema.     Vital Signs:  Temp (24hrs) Max:36.8 C (98.2 F)      Temperature: 36.7 C (98 F) (03/05/15 0756)  BP (Non-Invasive): (!) 123/53 (03/05/15 0756)  MAP (Non-Invasive): 70 mmHG (03/05/15 0756)  Heart Rate: 83 (03/05/15 0756)  Respiratory Rate: 20 (03/05/15 0756)  Pain Score (Numeric, Faces): 0 (03/05/15 0850)  SpO2-1: 97 % (03/05/15 0756)    I/O:  I/O last 24 hours:    Intake/Output Summary (Last 24 hours) at 03/05/15 0939  Last data filed at 03/05/15 0600   Gross per 24 hour   Intake              525 ml   Output              850 ml   Net             -325 ml     Labs:  All laboratory data reviewed this am  Creatinine:  1.21    Diagnostics:   EKG: No study available  EF: 55-60%  per TTE 03/04/2015  CCL: DES to LAD X 2 and Diagonal X 1    Current Medications:    Current Facility-Administered Medications:  allopurinol (ZYLOPRIM) tablet 100 mg Oral Daily   aspirin (ECOTRIN) enteric coated tablet 81 mg 81 mg Oral Daily   atorvastatin (LIPITOR) tablet 80 mg Oral QPM   heparin 5,000 unit/mL injection 5,000 Units Subcutaneous Q8HRS   levothyroxine (SYNTHROID) tablet 50 mcg Oral Daily   lisinopril (PRINIVIL) tablet 10 mg Oral Daily   metoprolol succinate (TOPROL-XL) 24 hr extended release tablet 50 mg Oral Daily   NS premix infusion  Intravenous Continuous   perflutren lipid microspheres (DEFINITY) 1.1 mg/mL injection 0.3 mL Intravenous Give in Cardiology       Allergies   Allergen Reactions    Aldactone [Spironolactone] Rash     Assessment/ Plan:    ASA:  Yes  Beta Blocker:  Yes  ACEI/ARB:  Yes  Statin:  Yes  Antiplatelets (Plavix, Brilinta, Effient): Yes  Spironolactone Indicated (Heart Failure): No:  Other Facial rash  DVT/PE Prophylaxis: Heparin    Disposition Planning: Home discharge  Jory Sims, APRN    I have seen and evaluated the patient with the APP and agree with the findings and assessment in the APP's note.  Any exceptions have been noted.    Blenda Mounts, MD, [facility], [facility]  Assistant Professor of Medicine, Section of Cardiology  [facility]

## 2015-03-06 LAB — TRANSTHORACIC ECHOCARDIOGRAM - ADULT
AV LVOT peak gradient: 2.72 mmHg
IVS: 0.76 cm (ref 0.6–1.1)
LVIDD: 6.07 cm (ref 3.5–6.0)
LVIDS: 31.5 percent (ref 2.1–4.0)
LVIDS: 4.16 cm (ref 2.1–4.0)
LVOT diameter: 1.63 cm
LVOT peak VTI: 16.6 cm
LVOT stroke volume: 34.52 milliliter
LVPWD: 1.06 cm
Left Ventricular Outflow Tract Mean Gradient: 1.5 mmHg
Left Ventricular Outflow Tract Mean Velocity: 58.33 cm/s
Left Ventricular Outflow Tract Peak Velocity: 82.53 cm/s
TR Max Vel: 219.3 cm/s
TR VELOCITY: 217.53 cm/s
TR VELOCITY: 221.06 cm/s
Tricuspid Valve Regurgitation Velocity Time Interval: 59.26 cm
Triscuspid Valve Regurgitation Peak Gradient: 19.24 mmHg

## 2015-03-06 NOTE — Care Management Notes (Addendum)
Care Coordinator/Social Work Plan  [place]  Patient Name: [name]   MRN: [id number]   Acct Number: [id number]  DOB: [birth year] Age: 82  **Admission Information**  Patient Type: INPATIENT  Admit Date: 03/04/2015 Admit Time: 06:32  Admit Reason: Atherosclerotic heart disease of native coronary artery without angina pectoris  Admitting Phys: Blenda Mounts   Attending Phys: Blenda Mounts   Unit: 9SE Bed: 33-A  Discharge Information  Actual D/C Date: 03/05/2015  Actual LOS: 1  Discharge Disposition: HOME/SELF CARE (Code 01)  160. LOC Notification, CMS Important Message / Detailed Notice  Created by : Cindie Laroche Date/Time 2015-03-06 12:21:39.000  Medicare Important Message/Detailed Notice  Admission- CMS-Important Message from Medicare About Your Rights (CMS-1405) IMM was delivered to the patient. A copy was  provided to the patient. (Date / Time) Patient 03/05/15 8:55am

## 2015-03-12 ENCOUNTER — Encounter (INDEPENDENT_AMBULATORY_CARE_PROVIDER_SITE_OTHER): Payer: Self-pay | Admitting: Internal Medicine

## 2015-03-12 ENCOUNTER — Ambulatory Visit (INDEPENDENT_AMBULATORY_CARE_PROVIDER_SITE_OTHER): Payer: MEDICARE | Admitting: Internal Medicine

## 2015-03-12 VITALS — BP 146/84 | HR 65 | Ht 62.0 in | Wt 160.0 lb

## 2015-03-12 DIAGNOSIS — I2510 Atherosclerotic heart disease of native coronary artery without angina pectoris: Secondary | ICD-10-CM

## 2015-03-12 DIAGNOSIS — I739 Peripheral vascular disease, unspecified: Secondary | ICD-10-CM

## 2015-03-12 MED ORDER — SPIRONOLACTONE 25 MG TABLET
25.0000 mg | ORAL_TABLET | Freq: Every day | ORAL | 5 refills | Status: DC
Start: 2015-03-12 — End: 2015-04-29

## 2015-03-12 MED ORDER — SACUBITRIL 24 MG-VALSARTAN 26 MG TABLET
1.00 | ORAL_TABLET | Freq: Two times a day (BID) | ORAL | 5 refills | Status: DC
Start: 2015-03-12 — End: 2016-03-31

## 2015-03-12 NOTE — Progress Notes (Signed)
See dictated note.  JL

## 2015-03-13 NOTE — Progress Notes (Signed)
[name] AND [facility]                              DEPARTMENT OF MEDICINE                                [place], [place] [address]                                PATIENT NAME: [name], [name] GGYIRS:[id number]  DATE OF SERVICE:03/12/2015  DATE OF BIRTH: 12-20-1933    PROGRESS NOTE    PRIMARY CARE PHYSICIAN:  Lamont Snowball, NP.    REASON FOR VISIT:  Followup of recent PCI.    SUBJECTIVE:  Ms. [name] is an 82 year old female who initially presented to me on February 24, 2015, with a new diagnosis of heart failure.  She had diffuse global hypokinesis with an estimated ejection fraction of 25% at that time.  She also had New York Heart Association class III-IV symptomatology.  As a result, we placed her on medical therapy that included metoprolol and Aldactone, in addition to the lisinopril she was previously taking and recommended a cardiac catheterization.  This was performed on March 04, 2015, which revealed significant multivessel coronary artery disease for which she underwent complex percutaneous coronary intervention to the diagonal and proximal to mid LAD with overlapping stents in the LAD.  This was a 2.75 mm Synergy stent proximally and a 2.5 mm Synergy stent distally.  The proximal stent was postdilated with a 3.0 mm noncompliant balloon.  She also had moderate mid circumflex disease at that time.      She presents today for followup.  She continues to complain of New York Heart Association class III symptomatology with dyspnea with minor exertion.  Her left ventricular function did appear improved following the procedure, but her symptoms continue to persist.  We did discontinue her Aldactone during her hospitalization for catheterization due to a facial rash she had developed after starting the Toprol and Aldactone.  Her rash is not improved to this point today, despite stopping Aldactone.    MEDICATIONS:  1.  Allopurinol 100 mg daily.  2.  Aspirin 81 mg  daily.  3.  Plavix 75 mg daily.  4.  Synthroid 50 mcg daily.  5.  Lisinopril 10 mg daily (this was discontinued on today's visit).  6.  Toprol-XL 50 mg daily (this was held on today's visit given her continued facial rash).    7.  Aldactone 25 mg daily (this was reinitiated on today's visit).  8.  Entresto 24-26 mg b.i.d. (this was initiated on today's visit.  We have instructed is a 48-hour washout and she will begin taking this on March 15, 2015).  9.  Lipitor 80 mg daily.    OBJECTIVE:  On exam, she is 5 feet 2 inches tall.  She weighs 160 pounds.  Her heart rate is 65 beats per minute.  Blood pressure is 146/84.  Oxygen saturation is 96% on room air.  She is in no acute distress.  Head is normocephalic, atraumatic.  Her face reveals a bilateral facial rash in the nasal folds, extending to the perioral area.  This is red and macular.  Her neck is supple without JVD, carotid bruit or thyromegaly.  Heart is regular rate and rhythm, no  murmurs, gallops or rubs.  Lungs are clear to auscultation bilaterally.  Abdomen is soft, nontender, nondistended.  Extremities reveal no peripheral edema.  She is alert and oriented x3.    ASSESSMENT:  Ms. [name] is an 82 year old female who presents for followup.    PLAN:  1.  Coronary artery disease.  At this point in time, her symptoms are relatively stable.  We will continue aspirin plus Plavix for an indefinite period of time but for a minimum of 12 months, status post drug-eluting stent placement.  Again, she has moderate to severe left circumflex disease which may be a target for intervention in the future.  In the interim, she will continue high intensity statin therapy as well.  2.  Systolic congestive heart failure with New York Heart Association class III symptomatology.  Given her continued class III symptomatology, we have recommended discontinuation of lisinopril and initiation of Entresto.  We have provided her with a copay card and a 79-month free trial.  We will  hold Toprol-XL in hopes to improve her rash; however, if this fails to improve her rash, we will reinitiate this.  Additionally, we have reinitiated spironolactone 25 mg daily to start today.  We will check a basic metabolic panel in 7-14 days, which we have provided a script for.  3.  Hypertension.  Blood pressure is mildly elevated.  Continue the above-listed medications.  4.  Follow up in 1 month here in the office.      Earl Lites, MD  Assistant Professor   [facility] Department of Medicine, Section of Cardiology    [id number]; D: 03/12/2015 15:17:27; T: 03/13/2015 08:54:05    cc: Lamont Snowball NP      [address] [place], [place] [address]

## 2015-03-20 ENCOUNTER — Ambulatory Visit (INDEPENDENT_AMBULATORY_CARE_PROVIDER_SITE_OTHER): Payer: Self-pay | Admitting: Internal Medicine

## 2015-03-20 ENCOUNTER — Other Ambulatory Visit (INDEPENDENT_AMBULATORY_CARE_PROVIDER_SITE_OTHER): Payer: Self-pay | Admitting: Internal Medicine

## 2015-03-20 NOTE — Telephone Encounter (Signed)
Pt daughter, Shaune Pascal, called the clinic today stating her mother had stents placed a week or so ago by Dr Cleone Slim. Daughter states since then, she has been having increased SOB to the point she is using a wedge pillow for sleeping. Daughter instructed to take patient to the ED for evaluation. Daughter states the patient is refusing to go to the ED, stating she does not feel that bad. This Clinical research associate strongly encouraged patient to  go to the ED, especially if symptoms are getting worse. Pt refuses, but asked for the first available apt. With Dr Cleone Slim. Pt was scheduled for Friday March 10 @ 1140am. Pt then agreed that if her symptoms get worse between now and the 10th, she "may" go to the ED. Pt and daughter given contact information for the clinic and the local ED. Daughter and patient verbalized understanding and agrees to contact the clinic with any additional changes. Elmon Kirschner, RN  03/20/2015, 13:04

## 2015-03-28 ENCOUNTER — Ambulatory Visit (INDEPENDENT_AMBULATORY_CARE_PROVIDER_SITE_OTHER): Payer: MEDICARE | Admitting: Internal Medicine

## 2015-03-28 ENCOUNTER — Encounter (INDEPENDENT_AMBULATORY_CARE_PROVIDER_SITE_OTHER): Payer: Self-pay | Admitting: Internal Medicine

## 2015-03-28 VITALS — BP 121/72 | HR 80 | Ht 62.0 in | Wt 159.0 lb

## 2015-03-28 DIAGNOSIS — R0602 Shortness of breath: Principal | ICD-10-CM

## 2015-03-28 DIAGNOSIS — I2510 Atherosclerotic heart disease of native coronary artery without angina pectoris: Secondary | ICD-10-CM

## 2015-03-28 LAB — BASIC METABOLIC PANEL
BUN: 18
CALCIUM: 9.5
CARBON DIOXIDE: 27
CHLORIDE: 103
CREATININE: 1
ESTIMATED GLOMERULAR FILTRATION RATE: 53
GLUCOSE,NONFAST: 114
POTASSIUM: 4.5
SODIUM: 138

## 2015-03-28 MED ORDER — FUROSEMIDE 20 MG TABLET
20.0000 mg | ORAL_TABLET | Freq: Every day | ORAL | 3 refills | Status: DC
Start: 2015-03-28 — End: 2015-04-29

## 2015-03-28 MED ORDER — METOPROLOL SUCCINATE ER 50 MG TABLET,EXTENDED RELEASE 24 HR
50.0000 mg | ORAL_TABLET | Freq: Every day | ORAL | 4 refills | Status: DC
Start: 2015-03-28 — End: 2015-03-28

## 2015-03-28 MED ORDER — FUROSEMIDE 20 MG TABLET
20.0000 mg | ORAL_TABLET | Freq: Every day | ORAL | 3 refills | Status: DC
Start: 2015-03-28 — End: 2015-03-28

## 2015-03-28 MED ORDER — METOPROLOL SUCCINATE ER 50 MG TABLET,EXTENDED RELEASE 24 HR
50.0000 mg | ORAL_TABLET | Freq: Every day | ORAL | 4 refills | Status: DC
Start: 2015-03-28 — End: 2015-04-21

## 2015-03-28 NOTE — Progress Notes (Signed)
See dictated note.  JL

## 2015-03-29 NOTE — Progress Notes (Signed)
[facility] AND [facility]                              DEPARTMENT OF MEDICINE                                [place], [place] [address]                                PATIENT NAME: [name], [name] KMQKMM:[id number]  DATE OF SERVICE:03/28/2015  DATE OF BIRTH: 11/06/1933    PROGRESS NOTE    REASON FOR VISIT:  Followup of congestive heart failure and coronary artery disease.    SUBJECTIVE:  Ms. [name] is an 82 year old female who has a known history of systolic congestive heart failure with normalization of her left ventricular function who last followed up with me in late February 2017 following a cardiac catheterization.  On March 04, 2015, she underwent multivessel percutaneous coronary intervention with intervention to the proximal-to-mid LAD as well as diagonal coronary arteries with 3 drug-eluting stents.  She reports that since this time she has had continued shortness of breath both at rest and with exertion that is worse of late.  This is concerning for New York Heart Association class III-IV symptomatology that persists despite medical management.  On our last visit she was also having a facial rash that we discontinued metoprolol for as well as previously we had him discontinue the Aldactone, both of which failed to resolve her rash.  She followed up with dermatology who did not feel this was a drug-related rash.  She had reinitiated Aldactone at our last visit and we also started her on Entresto 24-26 mg b.i.d.  She is tolerating that new medicine well.  She had a basic metabolic panel performed yesterday which revealed normal creatinine and electrolytes.  Other than worsening shortness of breath she denies any other new or worsening symptoms.    CURRENT MEDICATIONS:  1.  Allopurinol 100 mg daily.  2.  Aspirin 81 mg daily.  3.  Plavix 75 mg daily.  4.  Synthroid 50 mcg daily.  5.  Entresto 24-26 mg b.i.d.  6.  Toprol-XL 50 mg daily (this was reinitiated  today).  7.  Lasix 20 mg daily (this was initiated today).  8.  Aldactone 25 mg daily.  9.  Lipitor 80 mg daily.    OBJECTIVE:  On exam, she is 5 feet 2 inches tall, she weighs 159 pounds and this is stable from her last visit when she weighed 160 pounds.  Heart rate is 80 beats per minute, blood pressure is 121/72, oxygen saturation is 99% on room air.  She is in no acute distress.  Head is normocephalic and atraumatic.  There is subtle JVD approximately 5 cm above the sternal notch in a 35-degree angle.  Her heart is regular rate and rhythm, no murmurs, gallops or rubs.  Lungs:  Clear to auscultation bilaterally.  Abdomen:  Soft, nontender and nondistended.  Extremities reveal trace peripheral edema.  She is alert and oriented x3.    ASSESSMENT AND PLAN:  [name] is an 82 year old female with known multivessel coronary artery disease as well as systolic congestive heart failure with normalization of her left ventricular function, who presents for followup.  1.  Systolic congestive heart failure.  At this point in time, she has at least class 3 symptomatology despite medical therapy.  We will reinitiate Toprol-XL 50 mg daily as her rash did not improve.  We will also add in Lasix 20 mg to be taken daily.  We will follow up with her in approximately 2 weeks here in clinic to evaluate for improvement in symptoms.  She is to call in 2-3 days should her symptoms fail to improve with this dose of Lasix.  If we do not have an adequate diuretic response we will likely recommend increasing the Lasix to 40 mg daily.  2.  Coronary artery disease.  At this point in time, her symptoms are relatively stable.  Continue aspirin and Plavix for a minimum of 12 months status post drug-eluting stent placement.  We also continue beta-blocker and statin therapy.  She again will follow up in 2 weeks.  3.  Hypertension.  Blood pressure is well controlled.  Continue above listed medication without change.  4.  Follow up here in the  office in 2 weeks.      Earl LitesJonathan Sani Loiseau, MD  Assistant Professor   [facility] Department of Medicine, Section of Cardiology    ZO/[id number]; D: 03/28/2015 15:30:30; T: 03/29/2015 10:35:54    cc: Lamont SnowballKenneth Hawk NP      [address]       [place], [place] [phone]

## 2015-04-02 ENCOUNTER — Other Ambulatory Visit (INDEPENDENT_AMBULATORY_CARE_PROVIDER_SITE_OTHER): Payer: Self-pay | Admitting: Internal Medicine

## 2015-04-02 DIAGNOSIS — I2510 Atherosclerotic heart disease of native coronary artery without angina pectoris: Secondary | ICD-10-CM

## 2015-04-09 ENCOUNTER — Ambulatory Visit (INDEPENDENT_AMBULATORY_CARE_PROVIDER_SITE_OTHER): Payer: Self-pay | Admitting: Internal Medicine

## 2015-04-09 ENCOUNTER — Encounter (INDEPENDENT_AMBULATORY_CARE_PROVIDER_SITE_OTHER): Payer: Medicare Other | Admitting: Internal Medicine

## 2015-04-09 NOTE — Telephone Encounter (Signed)
Patients daughter pheobie called clinic, states [facility] for toprox Xl was being questioned. This writer called [name] @ [phone], spoke with Hoy MornKeela, prescription was given verbally and will be sent to the patient . Pheobie called and made aware of this . No further issues at this time. Elmon Kirschnerarey Francoise Chojnowski, RN  04/09/2015, 10:31

## 2015-04-11 ENCOUNTER — Encounter (INDEPENDENT_AMBULATORY_CARE_PROVIDER_SITE_OTHER): Payer: Medicare Other | Admitting: Internal Medicine

## 2015-04-21 ENCOUNTER — Other Ambulatory Visit (INDEPENDENT_AMBULATORY_CARE_PROVIDER_SITE_OTHER): Payer: Self-pay | Admitting: Internal Medicine

## 2015-04-21 ENCOUNTER — Ambulatory Visit (INDEPENDENT_AMBULATORY_CARE_PROVIDER_SITE_OTHER): Payer: MEDICARE | Admitting: Internal Medicine

## 2015-04-21 ENCOUNTER — Ambulatory Visit (INDEPENDENT_AMBULATORY_CARE_PROVIDER_SITE_OTHER): Payer: Self-pay | Admitting: Internal Medicine

## 2015-04-21 ENCOUNTER — Encounter (INDEPENDENT_AMBULATORY_CARE_PROVIDER_SITE_OTHER): Payer: Self-pay | Admitting: Internal Medicine

## 2015-04-21 VITALS — BP 134/76 | HR 66 | Ht 62.0 in | Wt 156.4 lb

## 2015-04-21 DIAGNOSIS — I509 Heart failure, unspecified: Secondary | ICD-10-CM

## 2015-04-21 LAB — BASIC METABOLIC PANEL
BUN: 39
CALCIUM: 9.7
CARBON DIOXIDE: 25
CHLORIDE: 99
CREATININE: 1.5
ESTIMATED GLOMERULAR FILTRATION RATE: 33
GLUCOSE,NONFAST: 89
POTASSIUM: 5.5
POTASSIUM: 5.5
SODIUM: 136

## 2015-04-21 MED ORDER — METOPROLOL SUCCINATE ER 25 MG TABLET,EXTENDED RELEASE 24 HR
25.0000 mg | ORAL_TABLET | Freq: Every day | ORAL | 4 refills | Status: DC
Start: 2015-04-21 — End: 2016-03-25

## 2015-04-21 NOTE — Telephone Encounter (Signed)
Received Labs drawn today BMP shows BUN 39, Creatinine 1.5 & Potassium 5.5. Dr [name] reviewed labs. New order received to hold aldactone  And recheck labs on Friday 04/25/2015. No changes made to the Lasix at this time , will recheck the BMP on Friday and review meds again at that time. Pheobe, pt daughter called and made aware of lab results and of new order from Dr [name] to hold the aldactone . If patient has any new symptoms, she understands to call into the office. Elmon Kirschnerarey Aislinn Feliz, RN  04/21/2015, 16:04

## 2015-04-22 ENCOUNTER — Encounter (INDEPENDENT_AMBULATORY_CARE_PROVIDER_SITE_OTHER): Payer: Self-pay | Admitting: Internal Medicine

## 2015-04-22 NOTE — Progress Notes (Signed)
This Clinical research associatewriter filed an Restaurant manager, fast foodappeal with [name] for the medication entresto. Called [phone], case # [id number]. Determination to be made within 72 hrs. Provided clinical documentation via phone and fax # [phone]. [name] Kittie Krizan, RN  04/22/2015, 15:28

## 2015-04-25 ENCOUNTER — Other Ambulatory Visit (INDEPENDENT_AMBULATORY_CARE_PROVIDER_SITE_OTHER): Payer: Self-pay | Admitting: Internal Medicine

## 2015-04-25 ENCOUNTER — Ambulatory Visit (INDEPENDENT_AMBULATORY_CARE_PROVIDER_SITE_OTHER): Payer: Self-pay | Admitting: Internal Medicine

## 2015-04-25 DIAGNOSIS — I509 Heart failure, unspecified: Secondary | ICD-10-CM

## 2015-04-25 NOTE — Telephone Encounter (Signed)
Received patients labs this afternoon. Scanned into system, given to Dr [name]. Dr [name] reviewed labs and medications, new order received to discontinue the aldactone and take the lasix 20 mg BID and have labs re-drawn next week. Message left for patient and patients daughter Arvil Chacoheobie with this information and requested a return call from patient and or daughter confirming this. Elmon Kirschnerarey Carolyne Whitsel, RN  04/25/2015, 15:35

## 2015-04-29 ENCOUNTER — Other Ambulatory Visit (INDEPENDENT_AMBULATORY_CARE_PROVIDER_SITE_OTHER): Payer: Self-pay | Admitting: Internal Medicine

## 2015-04-29 MED ORDER — FUROSEMIDE 20 MG TABLET
20.0000 mg | ORAL_TABLET | Freq: Two times a day (BID) | ORAL | 3 refills | Status: DC
Start: 2015-04-29 — End: 2015-06-09

## 2015-05-01 NOTE — Progress Notes (Signed)
See dictated note.  JL

## 2015-05-02 NOTE — Progress Notes (Signed)
[facility]                              [facility]                                [place], [place] [id number]                                PATIENT NAME: [name] AcheWOODFORD, Rheannon  HOSPITAL OZHYQM:[id number]:[id number]  DATE OF SERVICE:04/21/2015  DATE OF BIRTH: 07/29/1933    PROGRESS NOTE    PRIMARY CARE PROVIDER:  Lamont SnowballKenneth Hawk, NP.    REASON FOR VISIT:  Followup of congestive heart failure.    SUBJECTIVE:  [name] Acheancy Stauder is an 82 year old female with history of a New York Heart Association class II-III heart failure with reduced LV function and ejection fraction of approximately 35% on her most recent study from March 2017.  She reports she has mostly been doing well since her last visit.  She does admit that she continues to have shortness of breath despite medical therapy.  She remains compliant with her current medications, which include aspirin 81 mg daily, Lipitor 80 mg daily, Plavix 75 mg daily, Lasix 20 mg b.i.d., Toprol-XL 25 mg daily and Entresto 24-26 mg b.i.d. Of note, she has not had a recent basic metabolic panel since adjustments have been made in her regimen.  Additionally, she has known coronary artery disease, status post complex multivessel PCI to the LAD and diagonal coronary arteries back in February 2017.  Since her intervention, her LV function has improved to some degree.  Initially it was 20-25% and now it is up to 35-40%.    CURRENT MEDICATIONS:  1.  Aspirin 81 mg daily.  2.  Lipitor 80 mg daily.  3.  Plavix 75 mg daily.  4.  Lasix 20 mg b.i.d.  5.  Synthroid 100 mcg daily.  6.  Toprol-XL 25 mg daily.  7.  Entresto 24-26 mg b.i.d.    OBJECTIVE:  On exam, she is 5 feet 2 inches tall, she weighs 156 pounds, heart rate 66 beats per minute, blood pressure 134/76, oxygen saturation 96% on room air.  Head is normocephalic, atraumatic.  Neck is supple without JVD, carotid bruit or thyromegaly.  Heart is regular rate and rhythm, no murmurs, gallops or rubs.   Lungs are clear to auscultation bilaterally.  Abdomen is soft, nontender.  Extremities reveal no peripheral edema.  She is alert and oriented x3.    ASSESSMENT AND PLAN:  [name] is an 82 year old female who presents for followup of systolic congestive heart failure and coronary artery disease.  1.  Systolic congestive heart failure.  At this point in time, I am going to make no changes to her regimen despite continued shortness of breath, I feel that a basic metabolic panel will be needed to make further adjustments in her medication.  Should she have elevated potassium or creatinine, we will have to discontinue spironolactone and make changes to her Lasix as needed.  We will follow up with her over the phone.  We will also follow up here in clinic in approximately 1 month.  2.  Coronary artery disease.  At this point in time, she is presently denying chest pain.  I believe that her symptoms are related to  heart failure.  She has been compliant with her aspirin and Plavix and will continue to take this going forward.  She also continues to take a statin and beta-blocker therapy indefinitely.  3.  Follow up in 1 month.      Earl Lites, MD  Assistant Professor   [facility] Department of Medicine, Section of Cardiology    [id number]; D: 05/01/2015 11:46:06; T: 05/02/2015 15:56:00    cc: Lamont Snowball NP      [address] [place], [place] [address]

## 2015-05-26 ENCOUNTER — Encounter (INDEPENDENT_AMBULATORY_CARE_PROVIDER_SITE_OTHER): Payer: MEDICARE | Admitting: Internal Medicine

## 2015-06-09 ENCOUNTER — Ambulatory Visit (INDEPENDENT_AMBULATORY_CARE_PROVIDER_SITE_OTHER): Payer: MEDICARE | Admitting: Internal Medicine

## 2015-06-09 ENCOUNTER — Encounter (INDEPENDENT_AMBULATORY_CARE_PROVIDER_SITE_OTHER): Payer: Self-pay | Admitting: Internal Medicine

## 2015-06-09 VITALS — BP 126/58 | HR 65 | Ht 62.0 in | Wt 157.5 lb

## 2015-06-09 DIAGNOSIS — I2510 Atherosclerotic heart disease of native coronary artery without angina pectoris: Secondary | ICD-10-CM

## 2015-06-09 MED ORDER — FUROSEMIDE 20 MG TABLET
20.0000 mg | ORAL_TABLET | Freq: Every day | ORAL | 3 refills | Status: DC
Start: 2015-06-09 — End: 2016-03-25

## 2015-06-09 NOTE — Progress Notes (Signed)
See dictated note.  JL

## 2015-06-10 NOTE — Progress Notes (Signed)
[facility]                              DEPARTMENT OF MEDICINE                                [place], [place] [id number]                                PATIENT NAME: [name], Jlyn  HOSPITAL GMWNUU:[id number]:[id number]  DATE OF SERVICE:06/09/2015  DATE OF BIRTH: 1933/02/22    PROGRESS NOTE    PRIMARY CARE PROVIDER:  Lamont SnowballKenneth Hawk, NP.    REASON FOR VISIT:  Followup of systolic congestive heart failure.    SUBJECTIVE:  [name] is an 82 year old female patient of mine who has a longstanding history of New York Heart Association class II-III heart failure with a reduced LV systolic function and an ejection fraction approximately 35-40% most recently who presents today for followup.  She reports at this point in time, she is well compensated.  She denies chest pain or shortness of breath at this point in time.  She does report frequent cramping in her feet, legs and hands, however.  Since her last visit, she had blood work which revealed elevated potassium.  At that point in time, she also had a mildly elevated creatinine of 1.5.  We recommended discontinuation of Aldactone with repeat labs 4 days later.  Her potassium and normalize her creatinine had also normalized at that point in time.  She continues to take b.i.d. Lasix 20 mg daily with a good diuretic effect.  She is just an actively in cardiac rehabilitation and doing well.  She reports this is helping tremendously with her energy and endurance.  She reports even 1 day she walked for 45 minutes continuously without stopping.  She remains compliant with all other cardiac medications.  Of note, also she has a known history of coronary artery disease status post PCI back in February 2017.    CURRENT MEDICATIONS:  1.  Aspirin 81 mg daily.  2.  Plavix 75 mg daily.  3.  Lipitor 80 mg daily.  4.  Allopurinol.  5.  Lasix 20 mg b.i.d. (this was reduced to 20 mg daily for a trial period on today's visit.  If she has a weight  gain of greater than 3-4 pounds on her home scale she will resume b.i.d. dosing).  6.  Synthroid.  7.  Toprol-XL 25 mg daily.  8.  Entresto 24-26 mg b.i.d.    OBJECTIVE:  On exam, she is 5 feet 2 inches tall, she weighs 157 pounds, heart rate is 65 beats per minute, blood pressure 126/58, oxygen saturation 98% on room air.  She is in no acute distress.  Head is normocephalic and atraumatic.  Neck is supple without JVD, carotid bruit or thyromegaly.  Her heart is regular rate and rhythm, no murmurs, gallops or rubs.  Lungs:  Clear to auscultation bilaterally.  Abdomen:  Soft, nontender and nondistended.  Extremities reveal trace peripheral edema.  She is alert and oriented x3.    ASSESSMENT AND PLAN:  [name] is an 82 year old female who returns to clinic for followup of her congestive heart failure.  1.  Systolic congestive heart failure.  Her symptoms are well compensated at this time.  She has likely New York Heart Association class 1-2 symptomatology with adequate medical therapy.  We will continue Entresto plus Toprol-XL as well as needed.Lasix.  Currently, she is taking 20 mg b.i.d.  We have reduced this to 20 mg daily on today's visit.  Should she have any significant weight gain greater than 3-4 pounds, we will reinitiate 20 mg of Lasix daily.  He will follow up with her in approximately 3 months.  2.  Coronary artery disease.  At this point in time, she is presently asymmetric and stable.  Continue aspirin, statin and beta-blocker therapy.  She will also continue Plavix for a minimum of 12 months status post percutaneous coronary intervention which was performed in February 2017.  3.  Hypertension.  Blood pressure is well controlled.  Continue above listed medication without change.  4.  Hypothyroidism.  Continue Synthroid.  5.  Hyperlipidemia:  Continue Lipitor therapy.  Her most recent LDL was 102 on March 05, 2015.  This was prior to initiation of statin therapy.  We will recheck this in 3-6  months.  6.  Follow up in 3 months.      Earl Lites, MD  Assistant Professor   [facility] Department of Medicine, Section of Cardiology    [id number]; D: 06/09/2015 11:20:35; T: 06/10/2015 05:21:35    cc: Lamont Snowball NP      [address] [place], [place] [address]

## 2015-06-25 ENCOUNTER — Other Ambulatory Visit (INDEPENDENT_AMBULATORY_CARE_PROVIDER_SITE_OTHER): Payer: Self-pay | Admitting: Internal Medicine

## 2015-06-25 MED ORDER — ATORVASTATIN 80 MG TABLET
80.0000 mg | ORAL_TABLET | Freq: Every evening | ORAL | 5 refills | Status: DC
Start: 2015-06-25 — End: 2016-03-12

## 2015-09-10 ENCOUNTER — Encounter (INDEPENDENT_AMBULATORY_CARE_PROVIDER_SITE_OTHER): Payer: MEDICARE | Admitting: Internal Medicine

## 2015-09-23 ENCOUNTER — Ambulatory Visit (INDEPENDENT_AMBULATORY_CARE_PROVIDER_SITE_OTHER): Payer: Self-pay | Admitting: Internal Medicine

## 2015-09-23 NOTE — Telephone Encounter (Signed)
Pt daughter [name] called in stating pt is unable to sleep at night and it has been going on for a few weeks. She stated she doesn't feel it is related to SOB as patient is wide awake when not able to sleep. Pt is refusing to take anything from her PCP as her daughter stated "she will only take medication prescribed by [name]". Pt daughter reqesting that a sleep aid medication be sent into [facility]. She stated pt has a follow up with you next week, and she feels it would be better to start the sleep aid now so she can let you know at her follow up if it is working. I let pt know [name] is out of the office today, but I would send him a message. Pt daughter requesting phone call back at [phone]. Thanks [name] NakayamaKambra Marlaina Coburn, MA  09/23/2015, 10:11

## 2015-09-29 ENCOUNTER — Ambulatory Visit (INDEPENDENT_AMBULATORY_CARE_PROVIDER_SITE_OTHER): Payer: MEDICARE | Admitting: Internal Medicine

## 2015-09-29 ENCOUNTER — Encounter (INDEPENDENT_AMBULATORY_CARE_PROVIDER_SITE_OTHER): Payer: Self-pay | Admitting: Internal Medicine

## 2015-09-29 VITALS — BP 125/77 | HR 64 | Ht 62.0 in | Wt 155.6 lb

## 2015-09-29 DIAGNOSIS — I2510 Atherosclerotic heart disease of native coronary artery without angina pectoris: Principal | ICD-10-CM

## 2015-09-30 LAB — BASIC METABOLIC PANEL
BUN: 21
CALCIUM: 9.6
CALCIUM: 9.6
CARBON DIOXIDE: 26
CHLORIDE: 105
CREATININE: 1.1
ESTIMATED GLOMERULAR FILTRATION RATE: 48
GLUCOSE,NONFAST: 94
POTASSIUM: 4.3
SODIUM: 141

## 2015-10-01 ENCOUNTER — Encounter (INDEPENDENT_AMBULATORY_CARE_PROVIDER_SITE_OTHER): Payer: Self-pay | Admitting: Internal Medicine

## 2015-10-09 NOTE — Progress Notes (Signed)
PATIENT NAME: [name], [name]   HOSPITAL NUMBER:  [id number]  DATE OF SERVICE: 09/29/2015  DATE OF BIRTH:  28-Jul-1933    PROGRESS NOTE    PRIMARY CARE PROVIDER:  Lona Millard, MSN, CRNP    REASON FOR VISIT:  Followup of congestive heart failure.    SUBJECTIVE:  [name] is an 82 year old female patient of mine, who is well known to our office with longstanding New York Heart Association class II to III congestive heart failure due to reduced LV systolic function with estimated ejection fraction of 35% to 40%.  She remains on aggressive medical therapy and has done well over the last several months.  She does have some exertional shortness of breath, but this has greatly improved overall.  She continues to participate in cardiac rehabilitation  and is actually going to enter phase 3 of the cardiac rehabilitation, she has had so much success.  From a cardiac perspective,  she is very stable.  Her only complaint today is that she is having some difficulty with sleep. She reports she can fall asleep watching TV in a chair, a couch very easily, but when she goes to lay down in bed at night,  she becomes very restless, and is interested in pharmacologic intervention.  We counseled her extensively on sleep hygiene today and recommended mild sleep aids.    CURRENT MEDICATIONS:  1. Allopurinol 100 mg daily.  2. Aspirin 81 mg daily.  3. Lipitor 80 mg daily.  4. Plavix 75 mg daily.  5. Lasix 20 mg daily.  6. Synthroid 150 mcg daily.  7. Toprol-XL 25 mg daily.  8. Entresto 24/26 mg b.i.d.    OBJECTIVE:  On examination, she is 5 feet 2 inches tall.  She weighs 155 pounds.  Heart rate 64 beats per minute.  Blood pressure is 125/77, oxygen saturation 96% on room air.  She is in no acute distress.  Head is normocephalic, atraumatic.  Neck is supple, without JVD, carotid bruit, or thyromegaly. Heart is regular rate and rhythm.  No murmurs, gallops, or rubs.  Lungs are clear to auscultation bilaterally.  Abdomen is soft,  nontender, nondistended.  Extremities reveal no peripheral edema.  She is alert and oriented x3.    ASSESSMENT AND PLAN:  [name] is an 82 year old female who returns for followup of congestive heart failure.  1. Systolic congestive heart failure.  At this point time, she has well compensated New York Heart Association class I to II  symptomatology.  We will continue Entresto plus Toprol-XL and Lasix.  We will check a BMP, as well as magnesium level, and follow up with her in 6 months.  2. History of coronary artery disease.  At this point time she is presently asymptomatic and stable.  Continue aspirin, statin, and beta blocker therapy.  She will continue Plavix for 12 months status post PCI, which occurred in February 2017.  3. Hypertension.  Blood pressure well controlled.  Continue above-listed medication, unchanged.  4. History of hypothyroidism. Continue Synthroid.  5. Hyperlipidemia. Continue Lipitor therapy and  follow up with yearly lipid panels.  6. History of insomnia. She does report that Benadryl over-the-counter does help with her sleep sometimes. We encouraged her to take 25 mg of this as needed for aid with sleeping whenever she is having trouble falling asleep.  We also encouraged her to not watch TV in her bedroom.  Make sure her bedroom is cool and quiet and dark, as well as encouraged daily exercise.  She does affirm that when she has more exercise she sleeps better at night.  We will follow up with her in 6 months.        Lucilla EdinJonathan A Gaylin Osoria, MD  Assistant Professor, Section of Cardiology   [facility] Department of Medicine            CC:   Lona MillardKenneth W Hawk, MSN, CRNP   [address]   [facility]   [place], [place] [id number]       DD:  10/09/2015 10:16:44  DT:  10/09/2015 21:33:21 LB  D#:  [id number]

## 2015-10-09 NOTE — Progress Notes (Signed)
See dictated note.  JL

## 2015-10-16 ENCOUNTER — Other Ambulatory Visit (FREE_STANDING_LABORATORY_FACILITY): Payer: Self-pay | Admitting: EXTERNAL

## 2015-10-16 ENCOUNTER — Ambulatory Visit (INDEPENDENT_AMBULATORY_CARE_PROVIDER_SITE_OTHER): Payer: Self-pay | Admitting: Internal Medicine

## 2015-10-16 ENCOUNTER — Encounter (FREE_STANDING_LABORATORY_FACILITY): Admit: 2015-10-16 | Discharge: 2015-10-16 | Disposition: A | Discharge status: Home / Self Care | Payer: Self-pay

## 2015-10-16 NOTE — Telephone Encounter (Signed)
Patients daughter called into the clinic at this time . Daughter reports that the patient has had 2 incontinent episodes (which is unusual for her ), poor sleep, severe headache , severe fatigue and is warm to touch. Daughter reports that her VS are stable at 120/62 with HR 80's. Patient denies any increased SOB. Daughter reports that the patient cancelled cardiac rehab today because she felt so bad. This Clinical research associatewriter encouraged the daughter to take the patient to the ED for evaluation. Daughter wanted to know if Dr [name] wanted any labs, and if so, if he wanted her to stop in and get an order. Dr [name] notified of daughters concerns and of patient symptoms. Dr [name] also encouraged patient to have a comprehensive exam in the closest ED. Pheobie made aware of this and will take the patient to the local ED. This Clinical research associatewriter called [facility] ED and spoke with WillowAndy. Report was given on this patient and this writer faxed the most recent progress note for reference to the ED also @ fax #[phone]. Elmon Kirschnerarey Aryon Nham, RN  10/16/2015, 14:49

## 2015-10-18 LAB — URINE CULTURE: URINE CULTURE: >100000 — AB

## 2015-12-26 ENCOUNTER — Other Ambulatory Visit (INDEPENDENT_AMBULATORY_CARE_PROVIDER_SITE_OTHER): Payer: Self-pay | Admitting: Internal Medicine

## 2016-02-06 ENCOUNTER — Other Ambulatory Visit (INDEPENDENT_AMBULATORY_CARE_PROVIDER_SITE_OTHER): Payer: Self-pay | Admitting: Internal Medicine

## 2016-02-27 ENCOUNTER — Other Ambulatory Visit (INDEPENDENT_AMBULATORY_CARE_PROVIDER_SITE_OTHER): Payer: Self-pay | Admitting: Internal Medicine

## 2016-03-12 ENCOUNTER — Other Ambulatory Visit (INDEPENDENT_AMBULATORY_CARE_PROVIDER_SITE_OTHER): Payer: Self-pay | Admitting: Internal Medicine

## 2016-03-12 MED ORDER — ATORVASTATIN 80 MG TABLET
80.0000 mg | ORAL_TABLET | Freq: Every evening | ORAL | 4 refills | Status: DC
Start: 2016-03-12 — End: 2016-05-18

## 2016-03-20 ENCOUNTER — Other Ambulatory Visit (INDEPENDENT_AMBULATORY_CARE_PROVIDER_SITE_OTHER): Payer: Self-pay | Admitting: Internal Medicine

## 2016-03-22 NOTE — Telephone Encounter (Signed)
Not a [facility] patient.   Welford RocheShannon Ellery Meroney, RN  03/22/2016, 10:25

## 2016-03-25 ENCOUNTER — Other Ambulatory Visit (INDEPENDENT_AMBULATORY_CARE_PROVIDER_SITE_OTHER): Payer: Self-pay | Admitting: Internal Medicine

## 2016-03-25 MED ORDER — FUROSEMIDE 20 MG TABLET
20.0000 mg | ORAL_TABLET | Freq: Every day | ORAL | 3 refills | Status: DC
Start: 2016-03-25 — End: 2016-12-20

## 2016-03-25 MED ORDER — METOPROLOL SUCCINATE ER 25 MG TABLET,EXTENDED RELEASE 24 HR
25.0000 mg | ORAL_TABLET | Freq: Every day | ORAL | 4 refills | Status: DC
Start: 2016-03-25 — End: 2016-12-20

## 2016-03-31 ENCOUNTER — Encounter (INDEPENDENT_AMBULATORY_CARE_PROVIDER_SITE_OTHER): Payer: Self-pay | Admitting: Internal Medicine

## 2016-03-31 ENCOUNTER — Ambulatory Visit (INDEPENDENT_AMBULATORY_CARE_PROVIDER_SITE_OTHER): Payer: MEDICARE | Admitting: Internal Medicine

## 2016-03-31 DIAGNOSIS — I739 Peripheral vascular disease, unspecified: Secondary | ICD-10-CM

## 2016-03-31 DIAGNOSIS — I2510 Atherosclerotic heart disease of native coronary artery without angina pectoris: Secondary | ICD-10-CM

## 2016-03-31 MED ORDER — SACUBITRIL 24 MG-VALSARTAN 26 MG TABLET
1.0000 | ORAL_TABLET | Freq: Two times a day (BID) | ORAL | 5 refills | Status: DC
Start: 2016-03-31 — End: 2016-12-17

## 2016-03-31 MED ORDER — CLOPIDOGREL 75 MG TABLET
75.0000 mg | ORAL_TABLET | Freq: Every day | ORAL | 11 refills | Status: DC
Start: 2016-03-31 — End: 2016-12-20

## 2016-04-02 NOTE — Progress Notes (Signed)
See dictated note.  JL

## 2016-04-03 NOTE — Progress Notes (Signed)
PATIENT NAME: [name], [name]   HOSPITAL NUMBER:  [id number]  DATE OF SERVICE: 03/31/2016  DATE OF BIRTH:  July 11, 1933    PROGRESS NOTE    PRIMARY CARE PROVIDER:  Lona Millard, MSN, CRNP.    REASON FOR VISIT:  Routine followup.    SUBJECTIVE:  [name] is an 83 year old female patient of mine with long-standing New York Heart Association Class II-III congestive heart failure secondary to ischemic cardiomyopathy with reduced left ventricular systolic function and an estimated ejection fraction of 35-40%.  She remains on aggressive medical therapy and  has done well over the last 6-9 months.  She does have some exertional shortness of breath.  This has greatly improved overall and she is very stable.  At this point time, she remains active in phase 3 of cardiac rehabilitation since she has had so much success.  From a cardiac perspective, she remains stable without new complaints today.    CURRENT MEDICATIONS:  Current Outpatient Prescriptions   Medication Sig   . allopurinol (ZYLOPRIM) 100 mg Oral Tablet Take 100 mg by mouth Once a day   . aspirin (ECOTRIN) 81 mg Oral Tablet, Delayed Release (E.C.) Take 81 mg by mouth Once a day   . atorvastatin (LIPITOR) 80 mg Oral Tablet Take 1 Tab (80 mg total) by mouth Every evening   . clopidogrel (PLAVIX) 75 mg Oral Tablet Take 1 Tab (75 mg total) by mouth Once a day   . docusate sodium (COLACE) 100 mg Oral Capsule Take 100 mg by mouth Twice per day as needed for Constipation   . FOLIC ACID/MULTIVIT-MIN/LUTEIN (CENTRUM SILVER ORAL) Take by mouth   . furosemide (LASIX) 20 mg Oral Tablet Take 1 Tab (20 mg total) by mouth Once a day   . Levothyroxine 100 mcg Oral Capsule Take 50 mcg by mouth Takes daily   . metoprolol succinate (TOPROL-XL) 25 mg Oral Tablet Sustained Release 24 hr Take 1 Tab (25 mg total) by mouth Once a day   . sacubitril-valsartan (ENTRESTO) 24-26 mg Oral Tablet Take 1 Tab by mouth Twice daily         OBJECTIVE:  On exam, she is 5 feet 2 inches tall and  weighs 158 pounds.  Heart rate is 62 beats per minute.  Blood pressure 166/85.  Oxygen saturation 96% on room air.  She is in no acute distress.  Head is normocephalic, atraumatic.  Neck is supple without JVD, carotid bruit, or thyromegaly.  Heart is regular rate and rhythm.  No murmurs, gallops, or rubs.  Lungs are clear to auscultation bilaterally.  Abdomen is soft, nontender, nondistended.  Extremities reveal no peripheral edema.  She is alert and oriented x3.    ASSESSMENT/PLAN:  [name] is an 83 year old female who returns to clinic for followup of congestive heart failure.  1. Systolic congestive heart failure.  At this point in time, she is well compensated with New York Heart Association Class II symptomatology.  She will continue Entresto plus Toprol-XL and Lasix.  Of note, she was intolerant of Aldactone with a facial rash.  We will follow up with her in 6 months.  She is clinically euvolemic.  2. History of coronary artery disease.  At this point time, she is presently asymptomatic and stable.  Continue aspirin, statin and beta-blocker therapy.  She will continue Plavix on an indefinite basis.  3. Hypertension.  Blood pressure is well controlled.  Continue above-listed medication, unchanged.  4. Hyperlipidemia.  Continue Lipitor therapy and follow up  with yearly lipid panels.  She should   remain on high-intensity statin therapy given her underlying coronary artery disease.  5. Hypothyroidism.  Continue Synthroid.  Symptoms are stable.        Lucilla EdinJonathan A Frankee Gritz, MD  Assistant Professor, Section of Cardiology   [facility]            CC:   Lona MillardKenneth W Hawk, MSN, CRNP   [address]   [facility]   [place], [place] [id number]       DD:  04/02/2016 10:28:06  DT:  04/03/2016 18:12:36 DG  D#:  [id number]

## 2016-05-13 ENCOUNTER — Telehealth (INDEPENDENT_AMBULATORY_CARE_PROVIDER_SITE_OTHER): Payer: Self-pay | Admitting: Internal Medicine

## 2016-05-13 ENCOUNTER — Other Ambulatory Visit (INDEPENDENT_AMBULATORY_CARE_PROVIDER_SITE_OTHER): Payer: Self-pay | Admitting: Internal Medicine

## 2016-05-13 NOTE — Nursing Note (Signed)
Patient's daughter , Arvil Chaco, called and states that her mother got her lasix refilled through [facility] mail order pharmacy , However, she has since misplaced the Lasix and has not taken any for 2 days. Pheobie requests [facility] [place] This was completed per daughter request and Dr Cleone Slim approval at this time. Pheobie made aware . Elmon Kirschner, RN  05/13/2016, 11:17

## 2016-05-18 ENCOUNTER — Other Ambulatory Visit (INDEPENDENT_AMBULATORY_CARE_PROVIDER_SITE_OTHER): Payer: Self-pay | Admitting: Internal Medicine

## 2016-05-18 ENCOUNTER — Telehealth (INDEPENDENT_AMBULATORY_CARE_PROVIDER_SITE_OTHER): Payer: Self-pay | Admitting: Internal Medicine

## 2016-05-18 MED ORDER — ATORVASTATIN 80 MG TABLET
80.0000 mg | ORAL_TABLET | Freq: Every evening | ORAL | 4 refills | Status: DC
Start: 2016-05-18 — End: 2016-12-20

## 2016-05-18 NOTE — Nursing Note (Signed)
Patient stopped into the clinic @ this time to make sure her medication list is correct. Pt states that Dr Cleone Slim told her she could stop taking her aspirin 81 mg because she is bruising so badly and she takes Plavix. Pt states she has not gotten any Lipitor from [facility] pharmacy and just thought she was supposed to stop taking this medication . On review, Dr Cleone Slim records patient still taking aspirin and Lipitor. Patient states Dr Cleone Slim told her to stop the aspirin. This writer told patient to continue to take the Plavix and reordered the Lipitor at this time for the patient. Dr Cleone Slim to review medications and determine if patient can stay off Aspirin. Pt thanked this Clinical research associate for information . No further questions. Elmon Kirschner, RN  05/18/2016, 14:27

## 2016-07-10 DIAGNOSIS — I2510 Atherosclerotic heart disease of native coronary artery without angina pectoris: Secondary | ICD-10-CM

## 2016-07-10 DIAGNOSIS — I255 Ischemic cardiomyopathy: Secondary | ICD-10-CM

## 2016-07-10 DIAGNOSIS — R079 Chest pain, unspecified: Secondary | ICD-10-CM

## 2016-07-10 DIAGNOSIS — E039 Hypothyroidism, unspecified: Secondary | ICD-10-CM

## 2016-07-13 DIAGNOSIS — E039 Hypothyroidism, unspecified: Secondary | ICD-10-CM

## 2016-07-13 DIAGNOSIS — I2510 Atherosclerotic heart disease of native coronary artery without angina pectoris: Secondary | ICD-10-CM

## 2016-07-13 DIAGNOSIS — R0789 Other chest pain: Secondary | ICD-10-CM

## 2016-07-26 ENCOUNTER — Ambulatory Visit (INDEPENDENT_AMBULATORY_CARE_PROVIDER_SITE_OTHER): Payer: MEDICARE | Admitting: Internal Medicine

## 2016-07-26 VITALS — BP 174/76 | HR 62 | Ht 62.0 in | Wt 159.0 lb

## 2016-07-26 DIAGNOSIS — I2510 Atherosclerotic heart disease of native coronary artery without angina pectoris: Principal | ICD-10-CM

## 2016-07-26 NOTE — Progress Notes (Signed)
See dictated note.  JL

## 2016-07-26 NOTE — Progress Notes (Signed)
PATIENT NAME: [name], [name]   HOSPITAL NUMBER:  [id number]  DATE OF SERVICE: 07/26/2016  DATE OF BIRTH:  Jun 18, 1933    PROGRESS NOTE    PRIMARY CARE PROVIDER:  Lona MillardKenneth W Hawk, MSN, CRNP.    REASON FOR VISIT:  Followup after recent hospitalization.    SUBJECTIVE:  [name] is an 83 year old female with known coronary artery disease, status post multivessel PCI back in 2017, who is a regular patient of mine.  She had a previous ischemic cardiomyopathy with recovery of her ejection fraction with medical therapy and intervention.  She remains on study-proven medical therapy including Entresto, Toprol-XL and Lasix.  Of note, she was [facility] June 2018 for acute onset of chest pain.  She described chest pain in between her breasts, in the center of her chest.  It radiated to her left arm.  Her family called EMS and she was transported to [facility] where she was ruled out for acute myocardial infarction with serial negative cardiac enzymes.  Her NT proBNP was unremarkable as well.  She was later discharged home with close followup here.  She denies any recurrence of symptoms.  She was discharged with a prescription for nitroglycerin patch, which she is not using as she feels she does not needed.  She has had had no further episodes of recurrent chest pain and is otherwise feeling well.  She is wanting to start back to cardiac rehab, which we have authorized today.    CURRENT MEDICATIONS:  Current Outpatient Prescriptions   Medication Sig   . allopurinol (ZYLOPRIM) 100 mg Oral Tablet Take 100 mg by mouth Once a day   . atorvastatin (LIPITOR) 80 mg Oral Tablet Take 1 Tab (80 mg total) by mouth Every evening   . clopidogrel (PLAVIX) 75 mg Oral Tablet Take 1 Tab (75 mg total) by mouth Once a day   . docusate sodium (COLACE) 100 mg Oral Capsule Take 100 mg by mouth Twice per day as needed for Constipation   . FOLIC ACID/MULTIVIT-MIN/LUTEIN (CENTRUM SILVER ORAL)  Take by mouth   . furosemide (LASIX) 20 mg Oral Tablet Take 1 Tab (20 mg total) by mouth Once a day   . levothyroxine (SYNTHROID) 50 mcg Oral Tablet Take 50 mcg by mouth Every morning   . metoprolol succinate (TOPROL-XL) 25 mg Oral Tablet Sustained Release 24 hr Take 1 Tab (25 mg total) by mouth Once a day   . nitroGLYCERIN (NITRO-DUR) 0.2 Transdermal Patch 24 hr 0.2 mg by Transdermal route Once a day Remove patch daily after 12 to 14 hours as directed.   . nitroGLYCERIN (NITROSTAT) 0.4 mg Sublingual Tablet, Sublingual 0.4 mg by Sublingual route Every 5 minutes as needed for Chest pain for 3 doses over 15 minutes   . sacubitril-valsartan (ENTRESTO) 24-26 mg Oral Tablet Take 1 Tab by mouth Twice daily         OBJECTIVE:  On exam, she is a well-appearing 83 year old female who appears stated age.  Height is 5 feet 2 inches tall.  She weighs 159 pounds.  Her heart rate is 62 beats per minute.  Blood pressure is 174/76,  recheck is 130/70. Oxygen saturation 96% on room air.  She is in no acute distress.  Head is normocephalic, atraumatic.  Neck is supple without JVD, carotid bruit, or thyromegaly.  Heart is regular rate and rhythm.  No murmurs, gallops, or rubs.  Lungs are clear to auscultation bilaterally.  Abdomen is soft,  nontender, nondistended with normoactive bowel sounds.  Extremities reveal no peripheral edema.  She is alert and oriented x3.  Neurologic exam is grossly normal.    ASSESSMENT AND PLAN:  [name] is an 83 year old female who presents today for followup.  1. Coronary disease.  She did have a hospitalization for chest pain.  They had recommended a stress test, but she is not interested in pursuing this at this point in time.  She has only had 1 episode of chest pain and I agree that it is reasonable to hold off on stress testing for now.  Should she have recurrent symptoms, I would suggest further evaluation. She had distal small vessel LAD disease and LCX disease which was moderate at the time  of her prior complex PCI.  In the meantime, she will continue aspirin, Plavix, Lipitor and beta blocker therapy with followup here in 3 months.  She has sublingual nitroglycerin to use as needed.  She is not using her nitroglycerin patch.  I will follow up with her in 3 months.  2. Ischemic cardiomyopathy with recovered left ventricular function.  Continue Entresto and Toprol-XL.  She is clinically euvolemic.  She will continue Lasix.  Again, her NT proBNP was negative, and she is has very stable New York Heart Association class 1-2 symptomatology.  She will resume cardiac rehab.  3. Hyperlipidemia.  Continue Lipitor 80 mg daily due to her history of coronary artery disease.  She should remain on high-intensity statin therapy.  4. Hypertension.  Blood pressure is well controlled.  Continue above-listed medication without change.    I spent 15 minutes out of 25 minutes counseling her regarding:  CAD, CMP management        Lucilla Edin, MD  Assistant Professor, Section of Cardiology   [facility]            CC:   Lona Millard, MSN, CRNP   [address]   [place], [place] [address]       DD:  07/26/2016 13:28:02  DT:  07/26/2016 21:56:24 KG  D#:  [id number]

## 2016-07-29 DIAGNOSIS — R079 Chest pain, unspecified: Secondary | ICD-10-CM

## 2016-08-09 DIAGNOSIS — E039 Hypothyroidism, unspecified: Secondary | ICD-10-CM

## 2016-08-09 DIAGNOSIS — I2510 Atherosclerotic heart disease of native coronary artery without angina pectoris: Secondary | ICD-10-CM

## 2016-08-09 DIAGNOSIS — R0789 Other chest pain: Secondary | ICD-10-CM

## 2016-08-27 ENCOUNTER — Other Ambulatory Visit (INDEPENDENT_AMBULATORY_CARE_PROVIDER_SITE_OTHER): Payer: Self-pay | Admitting: Internal Medicine

## 2016-08-27 ENCOUNTER — Telehealth (INDEPENDENT_AMBULATORY_CARE_PROVIDER_SITE_OTHER): Payer: Self-pay | Admitting: Internal Medicine

## 2016-08-27 NOTE — Nursing Note (Signed)
This Clinical research associatewriter called [name] for [employer] on patient [name]. Spoke with HornbeakKaylan @ [phone]. Delfino LovettKaylan Is faxing authorization form for Dr [name] to fill out and return via fax to [phone] . Turn around time is 48-72 hrs for determination. Elmon Kirschnerarey Branden Vine, RN  08/27/2016, 09:02

## 2016-08-27 NOTE — Nursing Note (Signed)
Dr [name] filled out and returned required paperwork for prior auth for [name] through [employer]. [name] has been obtained for one year. This is scanned into media. Patient and patient daughter called and made aware of this . [name], RN  08/27/2016, 11:00

## 2016-10-06 ENCOUNTER — Encounter (INDEPENDENT_AMBULATORY_CARE_PROVIDER_SITE_OTHER): Payer: MEDICARE | Admitting: Internal Medicine

## 2016-10-12 ENCOUNTER — Encounter (HOSPITAL_BASED_OUTPATIENT_CLINIC_OR_DEPARTMENT_OTHER): Payer: Self-pay | Admitting: DERMATOLOGY

## 2016-10-12 ENCOUNTER — Ambulatory Visit (HOSPITAL_BASED_OUTPATIENT_CLINIC_OR_DEPARTMENT_OTHER): Payer: MEDICARE | Admitting: DERMATOLOGY

## 2016-10-12 DIAGNOSIS — L719 Rosacea, unspecified: Secondary | ICD-10-CM | POA: Insufficient documentation

## 2016-10-12 DIAGNOSIS — L409 Psoriasis, unspecified: Secondary | ICD-10-CM | POA: Insufficient documentation

## 2016-10-12 MED ORDER — METRONIDAZOLE 0.75 % TOPICAL CREAM: g | Freq: Two times a day (BID) | CUTANEOUS | 3 refills | 0 days | Status: AC

## 2016-10-12 MED ORDER — FLUOCINOLONE 0.01 % SCALP OIL AND SHOWER CAP: 1 | mL | 3 refills | 0 days | Status: AC

## 2016-10-12 MED ORDER — FLUOCINONIDE 0.05 % TOPICAL SOLUTION: mL | Freq: Two times a day (BID) | CUTANEOUS | 3 refills | 0 days | Status: AC

## 2016-10-12 NOTE — Progress Notes (Signed)
Dermatology Clinic, [facility]  [address]  [place] [address]  [phone]    Date:   10/12/2016  Name: [name]  Age: 83 y.o.    Chief complaint: Skin Check    HPI  Here for skin check.  Has been following with other derm for psoriasis scalp and rosacea.  Scalp still gets itch spots in spite of fluocinolone.  Cheeks occas pink.  No personal or familly hx skin cancer.  Careful in the sun.    Review of Systems   Constitutional: Negative for fever.   Cardiovascular: Negative for chest pain.     Current Medications   allopurinol (ZYLOPRIM) 100 mg Oral Tablet Take 100 mg by mouth Once a day    atorvastatin (LIPITOR) 80 mg Oral Tablet Take 1 Tab (80 mg total) by mouth Every evening    clopidogrel (PLAVIX) 75 mg Oral Tablet Take 1 Tab (75 mg total) by mouth Once a day    docusate sodium (COLACE) 100 mg Oral Capsule Take 100 mg by mouth Twice per day as needed for Constipation    [START ON 10/16/2016] Fluocinolone-Shower Cap (DERMA-SMOOTHE/FS SCALP OIL) 0.01 % Oil 1 Applicator by Scalp route Every Saturday    Fluocinonide (LIDEX) 0.05 % Solution Apply topically Twice daily    FOLIC ACID/MULTIVIT-MIN/LUTEIN (CENTRUM SILVER ORAL) Take by mouth    furosemide (LASIX) 20 mg Oral Tablet Take 1 Tab (20 mg total) by mouth Once a day    levothyroxine (SYNTHROID) 50 mcg Oral Tablet Take 50 mcg by mouth Every morning    metoprolol succinate (TOPROL-XL) 25 mg Oral Tablet Sustained Release 24 hr Take 1 Tab (25 mg total) by mouth Once a day    Metronidazole (METROCREAM) 0.75 % Cream Apply topically Twice daily    nitroGLYCERIN (NITRO-DUR) 0.2 Transdermal Patch 24 hr 0.2 mg by Transdermal route Once a day Remove patch daily after 12 to 14 hours as directed.    nitroGLYCERIN (NITROSTAT) 0.4 mg Sublingual Tablet, Sublingual 0.4 mg by Sublingual route Every 5 minutes as needed for Chest pain for 3 doses over 15 minutes    sacubitril-valsartan (ENTRESTO) 24-26 mg Oral Tablet Take 1 Tab by mouth Twice daily         Allergies   Allergen Reactions    Aldactone [Spironolactone] Rash     Past Medical History:   Diagnosis Date    Allergic rhinitis     Congestive heart failure (CMS HCC)     Convulsions (CMS HCC)     Esophageal reflux     Hypertension     Hypothyroidism     MI (myocardial infarction)          Physical Exam  Vitals: Temperature 36 C (96.8 F), height 1.575 m ( ), weight 68 kg (150 lb), not currently breastfeeding.  Physical Exam   Constitutional: She is oriented to person, place, and time. She appears well-developed and well-nourished. No distress.       HENT:   Head: Normocephalic and atraumatic.   Neurological: She is alert and oriented to person, place, and time.   Skin: Skin is warm and dry. She is not diaphoretic.   Psychiatric: She has a normal mood and affect.   General skin exam was performed including head, neck, anterior/posterior trunk, bilateral upper & lower extremities and revealed no areas of concern other than those documented.    Assessment and Plan  Problem List Items Addressed This Visit     Rosacea    Psoriasis  Plan  Refill metronidazole andfluocinolone solution  Start dermasmoothe weekly  Follow  The patient was educated on the importance of avoiding excessive sun exposure and wearing sunscreen daily.  Advised patient to re-apply sunscreen every 2-3 hours.  Advised the patient to avoid going to the tanning beds.  Advised to check skin routinely for any changes, especially any new moles or changes in existing moles.    Miles Costain, MD

## 2016-11-08 ENCOUNTER — Ambulatory Visit (INDEPENDENT_AMBULATORY_CARE_PROVIDER_SITE_OTHER): Payer: MEDICARE | Admitting: Internal Medicine

## 2016-11-08 ENCOUNTER — Encounter (INDEPENDENT_AMBULATORY_CARE_PROVIDER_SITE_OTHER): Payer: Self-pay | Admitting: Internal Medicine

## 2016-11-08 VITALS — BP 145/82 | HR 75 | Ht 62.0 in | Wt 157.6 lb

## 2016-11-08 DIAGNOSIS — I2510 Atherosclerotic heart disease of native coronary artery without angina pectoris: Principal | ICD-10-CM

## 2016-11-08 NOTE — Progress Notes (Signed)
See dictated note.  JL

## 2016-11-09 NOTE — Progress Notes (Signed)
PATIENT NAME: [name], [name]   HOSPITAL NUMBER:  [id number]  DATE OF SERVICE: 11/08/2016  DATE OF BIRTH:  01/02/1934    PROGRESS NOTE    PRIMARY CARE PROVIDER:  Lona MillardKenneth W Hawk, MSN, CRNP.    REASON FOR VISIT:  Followup of coronary artery disease.    SUBJECTIVE:  [name] is an 83 year old female with a history of ischemic cardiomyopathy, status post recovery of her left ventricular function as well as multivessel PCI back in 2017, who presents today for followup.  She reports she is doing well.  She remains on study-proven medical therapy, including Entresto, Toprol-XL and Lasix.  She denies any recent chest pain, shortness of breath or other concerning symptoms.  She participates regularly in cardiac rehab and to this date has participated in over 140 sessions.  She walked 2 miles in the [place] walk associated with [employer] [place] and has no cardiac symptoms or limitations.    CURRENT MEDICATIONS:  Current Outpatient Prescriptions   Medication Sig    allopurinol (ZYLOPRIM) 100 mg Oral Tablet Take 100 mg by mouth Once a day    atorvastatin (LIPITOR) 80 mg Oral Tablet Take 1 Tab (80 mg total) by mouth Every evening    clopidogrel (PLAVIX) 75 mg Oral Tablet Take 1 Tab (75 mg total) by mouth Once a day    docusate sodium (COLACE) 100 mg Oral Capsule Take 100 mg by mouth Twice per day as needed for Constipation    Fluocinolone-Shower Cap (DERMA-SMOOTHE/FS SCALP OIL) 0.01 % Oil 1 Applicator by Scalp route Every Saturday    Fluocinonide (LIDEX) 0.05 % Solution Apply topically Twice daily    FOLIC ACID/MULTIVIT-MIN/LUTEIN (CENTRUM SILVER ORAL) Take by mouth    furosemide (LASIX) 20 mg Oral Tablet Take 1 Tab (20 mg total) by mouth Once a day    levothyroxine (SYNTHROID) 50 mcg Oral Tablet Take 50 mcg by mouth Every morning    metoprolol succinate (TOPROL-XL) 25 mg Oral Tablet Sustained Release 24 hr Take 1 Tab (25 mg total) by mouth Once a day    Metronidazole (METROCREAM) 0.75 % Cream  Apply topically Twice daily    nitroGLYCERIN (NITRO-DUR) 0.2 Transdermal Patch 24 hr 0.2 mg by Transdermal route Once a day Remove patch daily after 12 to 14 hours as directed.    nitroGLYCERIN (NITROSTAT) 0.4 mg Sublingual Tablet, Sublingual 0.4 mg by Sublingual route Every 5 minutes as needed for Chest pain for 3 doses over 15 minutes    sacubitril-valsartan (ENTRESTO) 24-26 mg Oral Tablet Take 1 Tab by mouth Twice daily         OBJECTIVE:  On exam, she is a well-appearing 83 year old female who appears stated age, in no acute distress.  Vital signs are stable.  She is 5 feet 2 inches tall and weighs 157 pounds.  Heart rate is 75 beats per minute.  Blood pressure is 145/82.  Oxygen saturation is 98% on room air.  Head is normocephalic, atraumatic.  Neck is supple, without JVD, carotid bruit or thyromegaly.  Heart is regular rate and rhythm, no murmurs, gallops or rubs.  Lungs are clear to auscultation bilaterally.  Abdomen is soft, nontender and nondistended with normoactive bowel sounds.  Extremities reveal no peripheral edema.  She is alert and oriented x3.  Neurologic exam is grossly normal.    ASSESSMENT AND PLAN:  Ms. [name] is an 83 year old female who presents for followup.  1. Coronary artery disease.  At this point in time, she denies  chest pain, shortness of breath or other concerning symptoms.  Will continue aspirin, Plavix, Lipitor and beta-blocker therapy and follow up in 6 months.  She has sublingual nitroglycerin to use as needed but has not required this.  She previously was prescribed a nitroglycerin patch sometime ago but has discontinued this as well and does not need it at this point in time.  2. Ischemic cardiomyopathy.  She has recovered left ventricular function.  Continue Entresto and Toprol-XL.  She is clinically euvolemic.  She will continue Lasix.  Again, her most recent NT-proBNP was actually within normal limits.  She has very stable New York Heart Association class I to II  symptomatology.  3. Hyperlipidemia.  Continue Lipitor 80 mg daily given her known history of coronary artery disease.  4. Hypertension.  Blood pressure well controlled.  Continue above-listed medications without change.     I spent 15 out of 25 minutes counseling her regarding coronary artery disease and cardiomyopathy management.        Lucilla Edin, MD  Assistant Professor, Section of Cardiology   [facility]            CC:   Lona Millard, MSN, CRNP   [address]   [place], [place] [address]       DD:  11/08/2016 14:42:26  DT:  11/09/2016 09:15:05 VB  D#:  [id number]

## 2016-11-23 DIAGNOSIS — M25551 Pain in right hip: Secondary | ICD-10-CM

## 2016-12-16 ENCOUNTER — Other Ambulatory Visit (FREE_STANDING_LABORATORY_FACILITY): Payer: Self-pay

## 2016-12-16 ENCOUNTER — Encounter (FREE_STANDING_LABORATORY_FACILITY): Admit: 2016-12-16 | Discharge: 2016-12-16 | Disposition: A | Discharge status: Home / Self Care | Payer: Self-pay

## 2016-12-16 ENCOUNTER — Telehealth (INDEPENDENT_AMBULATORY_CARE_PROVIDER_SITE_OTHER): Payer: Self-pay

## 2016-12-16 DIAGNOSIS — I447 Left bundle-branch block, unspecified: Secondary | ICD-10-CM

## 2016-12-16 NOTE — Nursing Note (Signed)
This Clinical research associatewriter received a call from [place] @ [facility] cardiac rehab at this time. Aggie Cosierheresa reports that this patient came in to do cardiac rehab today and has been having chest pain. Aggie Cosierheresa wanted to know if Dr [name] had any available appointments tomorrow for the patient to be evaluated. This Clinical research associatewriter explained that Dr [name] has a full clinic schedule tomorrow and that if the patient is having chest pain, patient should go to the ED for evaluation anyway as they would have the necessary availability of equipment to adequately treat the patient . Aggie Cosierheresa verbalized understanding and will encourage the patient to go to the ED. Elmon Kirschnerarey Kveon Casanas, RN  12/16/2016, 11:37

## 2016-12-17 ENCOUNTER — Observation Stay (HOSPITAL_BASED_OUTPATIENT_CLINIC_OR_DEPARTMENT_OTHER): Payer: Medicare Other | Admitting: Cardiovascular Disease

## 2016-12-17 ENCOUNTER — Ambulatory Visit (HOSPITAL_BASED_OUTPATIENT_CLINIC_OR_DEPARTMENT_OTHER): Payer: Medicare Other

## 2016-12-17 ENCOUNTER — Ambulatory Visit (HOSPITAL_BASED_OUTPATIENT_CLINIC_OR_DEPARTMENT_OTHER): Payer: Medicare Other | Admitting: Radiology

## 2016-12-17 ENCOUNTER — Other Ambulatory Visit (INDEPENDENT_AMBULATORY_CARE_PROVIDER_SITE_OTHER): Payer: Self-pay | Admitting: Internal Medicine

## 2016-12-17 ENCOUNTER — Ambulatory Visit (HOSPITAL_COMMUNITY): Payer: Medicare Other

## 2016-12-17 ENCOUNTER — Observation Stay
Admission: EM | Admit: 2016-12-17 | Discharge: 2016-12-18 | Disposition: A | Discharge status: Home / Self Care | Payer: Medicare Other | Source: Other Acute Inpatient Hospital | Attending: Cardiovascular Disease | Admitting: Cardiovascular Disease

## 2016-12-17 DIAGNOSIS — Z7902 Long term (current) use of antithrombotics/antiplatelets: Secondary | ICD-10-CM | POA: Insufficient documentation

## 2016-12-17 DIAGNOSIS — I2510 Atherosclerotic heart disease of native coronary artery without angina pectoris: Secondary | ICD-10-CM

## 2016-12-17 DIAGNOSIS — Z79890 Hormone replacement therapy (postmenopausal): Secondary | ICD-10-CM | POA: Insufficient documentation

## 2016-12-17 DIAGNOSIS — Z79899 Other long term (current) drug therapy: Secondary | ICD-10-CM | POA: Insufficient documentation

## 2016-12-17 DIAGNOSIS — I214 Non-ST elevation (NSTEMI) myocardial infarction: Secondary | ICD-10-CM | POA: Diagnosis present

## 2016-12-17 DIAGNOSIS — R079 Chest pain, unspecified: Secondary | ICD-10-CM

## 2016-12-17 DIAGNOSIS — E785 Hyperlipidemia, unspecified: Secondary | ICD-10-CM | POA: Insufficient documentation

## 2016-12-17 DIAGNOSIS — I5030 Unspecified diastolic (congestive) heart failure: Secondary | ICD-10-CM | POA: Insufficient documentation

## 2016-12-17 DIAGNOSIS — I110 Hypertensive heart disease with heart failure: Secondary | ICD-10-CM | POA: Insufficient documentation

## 2016-12-17 DIAGNOSIS — I739 Peripheral vascular disease, unspecified: Secondary | ICD-10-CM

## 2016-12-17 DIAGNOSIS — E038 Other specified hypothyroidism: Secondary | ICD-10-CM

## 2016-12-17 DIAGNOSIS — I440 Atrioventricular block, first degree: Secondary | ICD-10-CM

## 2016-12-17 DIAGNOSIS — E039 Hypothyroidism, unspecified: Secondary | ICD-10-CM | POA: Insufficient documentation

## 2016-12-17 DIAGNOSIS — R17 Unspecified jaundice: Secondary | ICD-10-CM | POA: Insufficient documentation

## 2016-12-17 DIAGNOSIS — I252 Old myocardial infarction: Secondary | ICD-10-CM | POA: Insufficient documentation

## 2016-12-17 DIAGNOSIS — I361 Nonrheumatic tricuspid (valve) insufficiency: Secondary | ICD-10-CM

## 2016-12-17 DIAGNOSIS — Z955 Presence of coronary angioplasty implant and graft: Secondary | ICD-10-CM | POA: Insufficient documentation

## 2016-12-17 DIAGNOSIS — I447 Left bundle-branch block, unspecified: Secondary | ICD-10-CM

## 2016-12-17 LAB — BASIC METABOLIC PANEL
ANION GAP: 7 mmol/L (ref 4–13)
BUN/CREA RATIO: 25 — ABNORMAL HIGH (ref 6–22)
BUN: 29 mg/dL — ABNORMAL HIGH (ref 8–25)
CALCIUM: 9.5 mg/dL (ref 8.5–10.2)
CHLORIDE: 106 mmol/L (ref 96–111)
CO2 TOTAL: 27 mmol/L (ref 22–32)
CREATININE: 1.17 mg/dL — ABNORMAL HIGH (ref 0.49–1.10)
ESTIMATED GFR: 44 mL/min/1.73mˆ2 — ABNORMAL LOW (ref >59)
GLUCOSE: 109 mg/dL (ref 65–139)
POTASSIUM: 4 mmol/L (ref 3.5–5.1)
SODIUM: 140 mmol/L (ref 136–145)

## 2016-12-17 LAB — CBC WITH DIFF
BASOPHIL #: 0.1 x10ˆ3/uL (ref 0.00–0.20)
BASOPHIL %: 1 %
EOSINOPHIL #: 0.12 10*3/uL (ref 0.00–0.50)
HCT: 41.5 % (ref 33.5–45.2)
HGB: 14.2 g/dL (ref 11.2–15.2)
LYMPHOCYTE #: 2.13 x10ˆ3/uL (ref 1.00–4.80)
LYMPHOCYTE %: 19 %
MCH: 31.9 pg (ref 27.4–33.0)
MCHC: 34.1 g/dL (ref 32.5–35.8)
MCV: 93.4 fL (ref 78.0–100.0)
MONOCYTE #: 0.72 x10ˆ3/uL (ref 0.30–1.00)
MONOCYTE %: 6 %
MPV: 7.2 fL — ABNORMAL LOW (ref 7.5–11.5)
NEUTROPHIL #: 8.17 x10ˆ3/uL — ABNORMAL HIGH (ref 1.50–7.70)
NEUTROPHIL %: 73 %
PLATELETS: 206 x10ˆ3/uL (ref 140–450)
RBC: 4.45 x10?6/uL (ref 3.63–4.92)
RBC: 4.45 x10ˆ6/uL (ref 3.63–4.92)
RDW: 13 % (ref 12.0–15.0)
WBC: 11.2 x10ˆ3/uL — ABNORMAL HIGH (ref 3.5–11.0)

## 2016-12-17 LAB — LIPID PANEL
CHOL/HDL RATIO: 2.8
CHOLESTEROL: 122 mg/dL (ref <200)
HDL CHOL: 44 mg/dL — ABNORMAL LOW (ref >=49)
LDL CALC: 46 mg/dL (ref <=100)
NON-HDL: 78 mg/dL (ref <=190)
TRIGLYCERIDES: 161 mg/dL — ABNORMAL HIGH (ref <=150)
VLDL CALC: 32 mg/dL — ABNORMAL HIGH (ref <30)

## 2016-12-17 LAB — TRANSTHORACIC ECHOCARDIOGRAM - ADULT
AV mean gradient: 3
AVA VTI: 3
Biplane Simpson EF: 63
Interventricular Septum Diastolic Thickness by 2D: 1.1 cm
LA Volume Index: 14
LVIDD - 2D: 2.9 cm
LVIDS 2D: 2.2 cm
MV E/A: 1
MV E/A: 1
Please see Linked Document for Final Report.: 0
Please see Linked Document for Final Report.: ABNORMAL
Please see Linked Document for Final Report.: NORMAL
TR VELOCITY: 213

## 2016-12-17 LAB — ECG 12-LEAD
Atrial Rate: 64 {beats}/min
Atrial Rate: 64 {beats}/min
Atrial Rate: 62 {beats}/min
Calculated P Axis: 67 degrees
Calculated P Axis: 50 degrees
Calculated R Axis: -2 degrees
Calculated T Axis: 127 degrees
Calculated T Axis: 128 degrees
Calculated T Axis: 132 degrees
PR Interval: 210 ms
PR Interval: 214 ms
PR Interval: 212 ms
PR Interval: 212 ms
QRS Duration: 136 ms
QRS Duration: 146 ms
QRS Duration: 144 ms
QT Interval: 430 ms
QT Interval: 440 ms
QT Interval: 444 ms
QTC Calculation: 443 ms
QTC Calculation: 453 ms
QTC Calculation: 450 ms
Ventricular rate: 64 {beats}/min
Ventricular rate: 64 {beats}/min
Ventricular rate: 62 {beats}/min

## 2016-12-17 LAB — PT/INR: INR: 0.97 (ref 0.80–1.20)

## 2016-12-17 LAB — PHOSPHORUS: PHOSPHORUS: 3 mg/dL (ref 2.3–4.0)

## 2016-12-17 LAB — HEPATIC FUNCTION PANEL
ALBUMIN: 3.7 g/dL (ref 3.4–4.8)
ALKALINE PHOSPHATASE: 94 U/L (ref <150)
ALT (SGPT): 45 U/L (ref <55)
AST (SGOT): 53 U/L — ABNORMAL HIGH (ref 8–41)
BILIRUBIN TOTAL: 1.3 mg/dL (ref 0.3–1.3)
PROTEIN TOTAL: 6.7 g/dL (ref 6.0–8.0)

## 2016-12-17 LAB — MAGNESIUM
MAGNESIUM: 2.2 mg/dL (ref 1.6–2.5)
MAGNESIUM: 2.2 mg/dL (ref 1.6–2.5)

## 2016-12-17 LAB — HGA1C (HEMOGLOBIN A1C WITH EST AVG GLUCOSE)
ESTIMATED AVERAGE GLUCOSE: 103 mg/dL
HEMOGLOBIN A1C: 5.2 % (ref 4.0–6.0)

## 2016-12-17 LAB — PTT (PARTIAL THROMBOPLASTIN TIME): APTT: 25.1 seconds (ref 25.1–36.5)

## 2016-12-17 LAB — TROPONIN-I
TROPONIN I: <7 ng/L (ref 0–30)
TROPONIN I: <7 ng/L (ref 0–30)
TROPONIN I: <7 ng/L (ref 0–30)
TROPONIN I: <7 ng/L (ref 0–30)

## 2016-12-17 LAB — THYROID STIMULATING HORMONE (SENSITIVE TSH): TSH: 4.885 u[IU]/mL (ref 0.350–5.000)

## 2016-12-17 LAB — B-TYPE NATRIURETIC PEPTIDE: BNP: 27 pg/mL (ref <=100)

## 2016-12-17 MED ORDER — SACUBITRIL 24 MG-VALSARTAN 26 MG TABLET
1.00 | ORAL_TABLET | Freq: Two times a day (BID) | ORAL | Status: DC
Start: 2016-12-17 — End: 2016-12-18
  Administered 2016-12-17 – 2016-12-18 (×3): 1 via ORAL
  Filled 2016-12-17 (×4): qty 1

## 2016-12-17 MED ORDER — FUROSEMIDE 20 MG TABLET
20.0000 mg | ORAL_TABLET | Freq: Every day | ORAL | Status: DC
Start: 2016-12-17 — End: 2016-12-18
  Administered 2016-12-17 – 2016-12-18 (×2): 20 mg via ORAL
  Filled 2016-12-17 (×2): qty 1

## 2016-12-17 MED ORDER — METOPROLOL SUCCINATE ER 25 MG TABLET,EXTENDED RELEASE 24 HR
25.0000 mg | ORAL_TABLET | Freq: Every day | ORAL | Status: DC
Start: 2016-12-18 — End: 2016-12-18
  Administered 2016-12-18: 25 mg via ORAL
  Filled 2016-12-17: qty 1

## 2016-12-17 MED ORDER — SODIUM CHLORIDE 0.9 % (FLUSH) INJECTION SYRINGE
2.0000 mL | INJECTION | Freq: Three times a day (TID) | INTRAMUSCULAR | Status: DC
Start: 2016-12-17 — End: 2016-12-18
  Administered 2016-12-17 – 2016-12-18 (×5): 2 mL

## 2016-12-17 MED ORDER — SODIUM CHLORIDE 0.9 % (FLUSH) INJECTION SYRINGE
2.0000 mL | INJECTION | Freq: Three times a day (TID) | INTRAMUSCULAR | Status: DC
Start: 2016-12-17 — End: 2016-12-18
  Administered 2016-12-17 – 2016-12-18 (×3): 2 mL
  Administered 2016-12-18: 0 mL

## 2016-12-17 MED ORDER — CLOPIDOGREL 75 MG TABLET
75.0000 mg | ORAL_TABLET | Freq: Every day | ORAL | Status: DC
Start: 2016-12-17 — End: 2016-12-18
  Administered 2016-12-17 – 2016-12-18 (×3): 75 mg via ORAL
  Filled 2016-12-17 (×2): qty 1

## 2016-12-17 MED ORDER — DOCUSATE SODIUM 100 MG CAPSULE
100.00 mg | ORAL_CAPSULE | Freq: Two times a day (BID) | ORAL | Status: DC
Start: 2016-12-17 — End: 2016-12-18
  Administered 2016-12-17: 0 mg via ORAL
  Administered 2016-12-17: 100 mg via ORAL
  Administered 2016-12-18: 0 mg via ORAL
  Filled 2016-12-17: qty 1

## 2016-12-17 MED ORDER — LEVOTHYROXINE 50 MCG TABLET
50.00 ug | ORAL_TABLET | Freq: Every morning | ORAL | Status: DC
Start: 2016-12-17 — End: 2016-12-18
  Administered 2016-12-17: 50 ug via ORAL
  Administered 2016-12-18 (×2): 0 ug via ORAL
  Filled 2016-12-17 (×3): qty 1

## 2016-12-17 MED ORDER — ATORVASTATIN 10 MG TABLET
10.00 mg | ORAL_TABLET | Freq: Every evening | ORAL | Status: DC
Start: 2016-12-17 — End: 2016-12-17

## 2016-12-17 MED ORDER — SODIUM CHLORIDE 0.9 % (FLUSH) INJECTION SYRINGE
2.0000 mL | INJECTION | INTRAMUSCULAR | Status: DC | PRN
Start: 2016-12-17 — End: 2016-12-18

## 2016-12-17 MED ORDER — PERFLUTREN LIPID MICROSPHERES 1.1 MG/ML INTRAVENOUS SUSPENSION
0.3000 mL | INTRAVENOUS | Status: DC
Start: 2016-12-17 — End: 2016-12-18

## 2016-12-17 MED ORDER — ACETAMINOPHEN 325 MG TABLET
650.0000 mg | ORAL_TABLET | ORAL | Status: DC | PRN
Start: 2016-12-17 — End: 2016-12-18

## 2016-12-17 MED ORDER — NITROGLYCERIN 0.4 MG SUBLINGUAL TABLET
0.4000 mg | SUBLINGUAL_TABLET | SUBLINGUAL | Status: DC | PRN
Start: 2016-12-17 — End: 2016-12-18

## 2016-12-17 MED ORDER — PERFLUTREN LIPID MICROSPHERES 1.1 MG/ML INTRAVENOUS SUSPENSION
0.30 mL | INTRAVENOUS | Status: DC
Start: 2016-12-17 — End: 2016-12-18
  Administered 2016-12-17: 0.3 mL via INTRAVENOUS

## 2016-12-17 MED ORDER — HEPARIN (PORCINE) 5,000 UNIT/ML INJECTION SOLUTION
5000.0000 [IU] | Freq: Three times a day (TID) | INTRAMUSCULAR | Status: DC
Start: 2016-12-17 — End: 2016-12-18
  Administered 2016-12-17 – 2016-12-18 (×5): 5000 [IU] via SUBCUTANEOUS
  Filled 2016-12-17 (×6): qty 1

## 2016-12-17 MED ORDER — ATORVASTATIN 80 MG TABLET
80.00 mg | ORAL_TABLET | Freq: Every evening | ORAL | Status: DC
Start: 2016-12-17 — End: 2016-12-18
  Administered 2016-12-17: 80 mg via ORAL
  Filled 2016-12-17 (×2): qty 1

## 2016-12-17 MED ORDER — SACUBITRIL 24 MG-VALSARTAN 26 MG TABLET
1.0000 | ORAL_TABLET | Freq: Two times a day (BID) | ORAL | 5 refills | Status: AC
Start: 2016-12-17 — End: ?

## 2016-12-17 MED ORDER — ASPIRIN 81 MG CHEWABLE TABLET
81.0000 mg | CHEWABLE_TABLET | Freq: Every day | ORAL | Status: DC
Start: 2016-12-17 — End: 2016-12-18
  Administered 2016-12-17: 0 mg via ORAL
  Administered 2016-12-18 (×2): 81 mg via ORAL
  Filled 2016-12-17 (×2): qty 1

## 2016-12-17 MED ADMIN — heparin lock flush (porcine) 10 unit/mL intravenous solution: INTRAVENOUS | @ 14:00:00

## 2016-12-17 MED ADMIN — ONDANSETRON/ DEXAMETHASONE IVPB: ORAL | @ 12:00:00 | NDC 00143989001

## 2016-12-17 MED ADMIN — sodium chloride 0.9 % (flush) injection syringe: @ 12:00:00

## 2016-12-17 NOTE — Nurses Notes (Signed)
Patient transferred from outside facility via stretcher by EMS. Patient Ox4, stable condition. Patient has glasses, upper dentures, partial lower dentures, left and right side hearing aids in her possession. Assessed per flow sheet. No visible wounds present. Patient denies any open wounds. Bruses on upper extremities. Patient claims are from the medications she takes.

## 2016-12-17 NOTE — H&P (Signed)
[facility]  Cardiology ADMISSION  History and Physical      [name],[name], 83 y.o. female  Encounter Start Date:  12/17/2016  Inpatient Admission Date: 12/17/2016   Date of Service: 12/17/2016  Admission Source: Outside Facility: [facility]  Date of Birth:  [birth year]  PCP:  Lamont SnowballKenneth Hawk, CRNP    Information Obtained from: patient  Chief Complaint:  Chest Pain, Recent Viral Illness    HPI:  [name] is a very pleasant and alert 83 year old female with a past medical history of CAD, HTN, and HLD.  She is a patient of Dr. Earl LitesJonathan Lanham and had PCI to LAD and Diagonal branch in February of 2017.  Patient had preserved LVEF at this time.  Patient is compliant and is enrolled in cardiac rehab.  Patient had a recent viral illness that resulted is vomiting and diarrhea.  [facility] with [facility].  Patient was then transferred to [facility] for cardiac evaluation.    Patient ECG, troponins and chest xray was WNL.  Patient is afebrile and stable.    Cardiac Risk Factors:    Past cardiac history: Yes  Current/Recent Smoker (within 1 year): No  Hypertension:   (BP 140/90): Yes  Dyslipidemia: (Total Chol greater than 200/LDL greater than 130 mg/dL): Yes  Fam Hx of Premature CAD:(female less than 2653yrs; female less than 66 yrs): No  Cerebrovascular Disease :  No  Peripheral Arterial Disease: No  Diabetes Mellitus: No  Currently On Dialysis:  No  Chronic Lung Disease:  No  Cardiac Arrest w/in 24 Hours: No  Prior history of MI:  No  Prior PCI: Yes: Most Recent PCI Date: 03/04/2015  MI this admission: No      CAD Presentation:  No symptoms or angina in last 14 days   Anginal Classification w/in 2 Weeks:  CCS II  slight limitation of activity - angina with walking/stairs/emotional stress, etc.  Prior CABG: No  Prior Valve Surgery/Procedure:  No  Pre-operative Evaluation before Non-Cardiac Surgery: No  Prior Heart Failure: No  Cardiomyopathy: No  LV  Systolic Dysfunction:  No  Stress or Imaging Studies Performed:  Stress Testing w/SPECT/MPI:   negative  Coagulopathy: No    Past Medical History:   Diagnosis Date   . Allergic rhinitis    . Congestive heart failure (CMS HCC)    . Convulsions (CMS HCC)    . Esophageal reflux    . Hypertension    . Hypothyroidism    . MI (myocardial infarction) (CMS [facility])        Past Surgical History:   Procedure Laterality Date   . HX APPENDECTOMY     . HX COLON SURGERY (ANY)     . HX HYSTERECTOMY     . HX KNEE REPLACMENT     . HX TAH AND BSO     . HX THYROID BIOPSY     . HX WISDOM TEETH EXTRACTION         Prior to Admission Medications:  Medications Prior to Admission     Prescriptions    allopurinol (ZYLOPRIM) 100 mg Oral Tablet    Take 100 mg by mouth Once a day    atorvastatin (LIPITOR) 80 mg Oral Tablet    Take 1 Tab (80 mg total) by mouth Every evening    clopidogrel (PLAVIX) 75 mg Oral Tablet    Take 1 Tab (75 mg total) by mouth Once a day  docusate sodium (COLACE) 100 mg Oral Capsule    Take 100 mg by mouth Twice per day as needed for Constipation    Fluocinolone-Shower Cap (DERMA-SMOOTHE/FS SCALP OIL) 0.01 % Oil    1 Applicator by Scalp route Every Saturday    Fluocinonide (LIDEX) 0.05 % Solution    Apply topically Twice daily    FOLIC ACID/MULTIVIT-MIN/LUTEIN (CENTRUM SILVER ORAL)    Take by mouth    furosemide (LASIX) 20 mg Oral Tablet    Take 1 Tab (20 mg total) by mouth Once a day    levothyroxine (SYNTHROID) 50 mcg Oral Tablet    Take 50 mcg by mouth Every morning    metoprolol succinate (TOPROL-XL) 25 mg Oral Tablet Sustained Release 24 hr    Take 1 Tab (25 mg total) by mouth Once a day    Metronidazole (METROCREAM) 0.75 % Cream    Apply topically Twice daily    nitroGLYCERIN (NITRO-DUR) 0.2 Transdermal Patch 24 hr    0.2 mg by Transdermal route Once a day Remove patch daily after 12 to 14 hours as directed.    nitroGLYCERIN (NITROSTAT) 0.4 mg Sublingual Tablet, Sublingual    0.4 mg by Sublingual route Every 5  minutes as needed for Chest pain for 3 doses over 15 minutes    sacubitril-valsartan (ENTRESTO) 24-26 mg Oral Tablet    Take 1 Tab by mouth Twice daily    sacubitril-valsartan (ENTRESTO) 24-26 mg Oral Tablet    Take 1 Tab by mouth Twice daily        Allergies   Allergen Reactions   . Aldactone [Spironolactone] Rash     Dye Allergy:  No  Iodine Allergy:  No    ROS:    Constitutional: negative for fevers, chills, sweats, fatigue and malaise  Eyes: negative for cataracts, glaucoma and visual disturbance  Ears, nose, mouth, throat, and face: negative for hearing loss, epistaxis and snoring  Respiratory: negative for cough, pleurisy/chest pain or dyspnea on exertion  Cardiovascular: negative for chest pain, palpitations, irregular heart beats, near-syncope, syncope and fatigue  Gastrointestinal: negative for reflux symptoms, change in bowel habits, diarrhea and constipation  Genitourinary:negative for frequency, dysuria, nocturia, urinary incontinence and hesitancy  Integument/breast: negative for rash, skin lesion(s), pruritus, dryness and skin color change  Hematologic/lymphatic: negative for easy bruising, bleeding, lymphadenopathy and petechiae  Musculoskeletal:negative for myalgias, arthralgias, stiff joints and neck pain  Neurological: negative for headaches, dizziness, vertigo, seizures and memory problems  Behavioral/Psych: negative for anxiety and depression  Endocrine: negative for diabetic symptoms including polyuria, polydipsia and polyphagia  Allergic/Immunologic: negative for urticaria, hay fever, angioedema and anaphylaxis    Exam:  Temperature: 36.7 C (98 F)  Heart Rate: 65  BP (Non-Invasive): 139/80  Respiratory Rate: 20  SpO2-1: 96 %  Pain Score (Numeric, Faces): 0   Patient is resting in bed with no complaints or concerns at this time.  Head is normocephalic, pupils are equal, round, react to light and accommodate.  Sclera is non-icteric.  Conjunctivae are pink.  Oral mucosa is pink and moist.  Neck  is supple with full range of motion, no carotid bruits, thyromegaly, or jugular vein distention.  Cardiac exam demonstrates a regular heart rate and rhythm, S1 and S2 heart sounds are audible and preserved with no murmur, rub, or gallop.  Abdomen is soft, non-tender, obese, and bowel sounds are active in all 4 quadrants. Respiratory effort is normal and lungs have faint rhonchi that clears with cough.  Lower extremities are well  perfused with 2+ palpable pulses and no edema.      Patient has decision making capacity:  Yes  Advance Directive:  Living Will  Code Status:  Full Code    Assessment/Plan:    1.  Chest pain in light of CAD  -Patient troponin, EKG, and chest X-Ray are WNL  -TTE today  -MPS in am  -NPO at midnight  -Continue ASA, Plavix, Toprol, and Entresto    2.  Hypertension  -Patient BP well controlled on admission  -Continue current home antihypertensive medications    3.  Hyperlipidemia  -LDL 161  -Patient on maximum dose statin  -Will inquire in regards to compliance    4.  HFpEF  -Patient EF improved to 55%  -Patient euvolemic on exam  -Continue Lasix, Entresto, and Beta Blocker    5.  Viral Illness  -Recent vomiting and diarrhea  -Creatinine slightly elevated  -Will encourage fluids  -Re-evaluate BMP in am    6.  Hypothyroidism  -TSH 4.885  -Continue Levothyroxine    7.  Elevated Bilirubin  -S/P vomiting and Diarrhea  -No complaints of abdominal pain  -Continue to monitor    DVT/PE Prophylaxis: Heparin    Jory SimsMargaret Boka, APRN,NP-C    I personally saw and evaluated the patient on 12/17/2016. See mid-level's note for additional details. My findings/participation are similar to those documented above by Ms. Boka.  We will proceed with a myocardial perfusion study for further risk stratification.    Ephriam KnucklesJames D Mills, MD

## 2016-12-17 NOTE — Care Management Notes (Signed)
[facility]  Care Management Note    Patient Name: [name]  Date of Birth: [birth year]  Sex: female  Date/Time of Admission: 12/17/2016  9:06 AM  Room/Bed: 1072/A  Payor: MEDICARE / Plan: MEDICARE PART A AND B / Product Type: Medicare /    LOS: 0 days   PCP: Lamont SnowballKenneth Hawk, CRNP    Admitting Diagnosis:  NSTEMI (non-ST elevated myocardial infarction) ([facility]) [I21.4]  NSTEMI (non-ST elevated myocardial infarction) ([facility]) [I21.4]       12/17/16 2217   Assessment Details   Assessment Type Other   Referral Information   Admission Type observation  (visited pt at bedside to review Obs Letter and obtain signature,. Per pt request she would like her daughter Mickeal Skinnerhoebe to hear and review the information and sign the Obs Letter. )       The patient will continue to be evaluated for developing discharge needs.     Case Manager: Wyvonnia Duskyebeca Orma Cheetham, RN  Phone: [phone]

## 2016-12-17 NOTE — Care Management Notes (Signed)
Utilization Review Determination    SECTION I  Reason for Physician Advisor Referral: Does not meet inpatient criteria. 12/17/2016    Current MERLIN MD Order: inpatient    Utilization Review Findings: Patient meets observation status.  Utilization Review MD: Dr. Silvio ClaymanKaren Clark    Based on clinical information available to me as per above and in chart, the patient's status should be: Observation    Eden LatheKaren Wilene Pharo, Utilization Review RN 12/17/2016, 16:34  -------------------------------------------------------------------------------------------------------------------  1.  I have reviewed this case with the patient's treating physician Ria BushMohammad Osman MD, and the MD agrees with this change in status.  They are aware of the referral to the Utilization Review Committee.    2.  The patient will be notified by Sheryle Sprayebecca Gonzales DCP on 12/17/2016 of changes in level of care.  Please refer to the education documentation and/or Allscripts for specific time of delivery.  DCP on 10E not answering- talked to Bjosc LLCCM Supervisor and advised to call CM in ED Sheryle Sprayebecca Gonzales. Left Message with need for observation letter to be given.    Eden LatheKaren Martino Tompson, Utilization Review RN 12/17/2016, 16:34  (Patient notification is required only if the status is changed while an Inpatient to Observation Status)  -------------------------------------------------------------------------------------------------------------------

## 2016-12-17 NOTE — Nurses Notes (Signed)
Unable to verify home medications. Patient states she does not know what she takes she just takes them. She stated her daughter would know the names of her medications and [facility]ologist works [facility].

## 2016-12-18 ENCOUNTER — Observation Stay (HOSPITAL_COMMUNITY): Payer: Medicare Other

## 2016-12-18 ENCOUNTER — Observation Stay (HOSPITAL_BASED_OUTPATIENT_CLINIC_OR_DEPARTMENT_OTHER): Payer: Medicare Other

## 2016-12-18 DIAGNOSIS — E038 Other specified hypothyroidism: Secondary | ICD-10-CM

## 2016-12-18 DIAGNOSIS — I110 Hypertensive heart disease with heart failure: Secondary | ICD-10-CM

## 2016-12-18 DIAGNOSIS — I5030 Unspecified diastolic (congestive) heart failure: Secondary | ICD-10-CM

## 2016-12-18 DIAGNOSIS — R079 Chest pain, unspecified: Secondary | ICD-10-CM

## 2016-12-18 DIAGNOSIS — I2510 Atherosclerotic heart disease of native coronary artery without angina pectoris: Secondary | ICD-10-CM

## 2016-12-18 DIAGNOSIS — E785 Hyperlipidemia, unspecified: Secondary | ICD-10-CM

## 2016-12-18 LAB — BASIC METABOLIC PANEL
ANION GAP: 8 mmol/L (ref 4–13)
BUN/CREA RATIO: 30 — ABNORMAL HIGH (ref 6–22)
BUN: 32 mg/dL — ABNORMAL HIGH (ref 8–25)
CALCIUM: 9.4 mg/dL (ref 8.5–10.2)
CHLORIDE: 107 mmol/L (ref 96–111)
CO2 TOTAL: 24 mmol/L (ref 22–32)
CREATININE: 1.08 mg/dL (ref 0.49–1.10)
ESTIMATED GFR: 49 mL/min/1.73mˆ2 — ABNORMAL LOW (ref >59)
GLUCOSE: 114 mg/dL (ref 65–139)
POTASSIUM: 4.2 mmol/L (ref 3.5–5.1)
SODIUM: 139 mmol/L (ref 136–145)

## 2016-12-18 LAB — CBC
HCT: 39.1 % (ref 33.5–45.2)
HGB: 13.7 g/dL (ref 11.2–15.2)
MCH: 32.7 pg (ref 27.4–33.0)
MCHC: 35.1 g/dL (ref 32.5–35.8)
MCV: 93.4 fL (ref 78.0–100.0)
MPV: 7.2 fL — ABNORMAL LOW (ref 7.5–11.5)
PLATELETS: 203 x10ˆ3/uL (ref 140–450)
RBC: 4.19 x10ˆ6/uL (ref 3.63–4.92)
RDW: 13.1 % (ref 12.0–15.0)
WBC: 9 x10ˆ3/uL (ref 3.5–11.0)

## 2016-12-18 LAB — TROPONIN-I
TROPONIN I: 12 ng/L (ref 0–30)
TROPONIN I: <7 ng/L (ref 0–30)
TROPONIN I: <7 ng/L (ref 0–30)
TROPONIN I: <7 ng/L (ref 0–30)

## 2016-12-18 LAB — MAGNESIUM: MAGNESIUM: 2 mg/dL (ref 1.6–2.5)

## 2016-12-18 LAB — PHOSPHORUS: PHOSPHORUS: 4 mg/dL (ref 2.3–4.0)

## 2016-12-18 MED ORDER — REGADENOSON 0.4 MG/5 ML INTRAVENOUS SYRINGE
0.40 mg | INJECTION | INTRAVENOUS | Status: AC
Start: 2016-12-18 — End: 2016-12-18
  Administered 2016-12-18: 0.4 mg via INTRAVENOUS

## 2016-12-18 MED ORDER — ASPIRIN 81 MG CHEWABLE TABLET
81.0000 mg | CHEWABLE_TABLET | Freq: Every day | ORAL | Status: DC
Start: 2016-12-18 — End: 2016-12-20

## 2016-12-18 MED ADMIN — metoprolol succinate ER 25 mg tablet,extended release 24 hr: ORAL | @ 09:00:00

## 2016-12-18 MED ADMIN — clopidogreL 75 mg tablet: ORAL | @ 09:00:00

## 2016-12-18 MED ADMIN — sodium chloride 0.9 % (flush) injection syringe: @ 14:00:00

## 2016-12-18 NOTE — Care Plan (Signed)
Problem: Patient Care Overview (Adult,OB)  Goal: Plan of Care Review(Adult,OB)  The patient and/or their representative will communicate an understanding of their plan of care   Outcome: Ongoing (see interventions/notes)  D/C to home

## 2016-12-18 NOTE — Care Plan (Signed)
Problem: Fall Risk (Adult)  Goal: Absence of Falls  Patient will demonstrate the desired outcomes by discharge/transition of care.   Outcome: Adequate for Discharge Date Met: 12/18/16

## 2016-12-18 NOTE — Nurses Notes (Signed)
Service here and rounded on patient. Stated that Stress test looked good and they could send her home today. Granddaughter is to come and transport patient home.

## 2016-12-18 NOTE — Care Plan (Signed)
Problem: Patient Care Overview (Adult,OB)  Goal: Individualization/Patient Specific Goal(Adult/OB)  Outcome: Ongoing (see interventions/notes)

## 2016-12-18 NOTE — Care Plan (Signed)
Problem: Patient Care Overview (Adult,OB)  Goal: Plan of Care Review(Adult,OB)  The patient and/or their representative will communicate an understanding of their plan of care   Outcome: Ongoing (see interventions/notes)    Goal: Individualization/Patient Specific Goal(Adult/OB)  Outcome: Ongoing (see interventions/notes)  Patient A&Ox4. Patient was NPO after midnight to undergo stress test in the morning. No complaints of pain or shortness of breath this shift. Patient ambulated to the bathroom during the night. Sitter select in place for safety.     Problem: Fall Risk (Adult)  Goal: Identify Related Risk Factors and Signs and Symptoms  Related risk factors and signs and symptoms are identified upon initiation of Human Response Clinical Practice Guideline (CPG).   Outcome: Ongoing (see interventions/notes)    Goal: Absence of Falls  Patient will demonstrate the desired outcomes by discharge/transition of care.   Outcome: Ongoing (see interventions/notes)      Problem: Cardiac: ACS (Acute Coronary Syndrome) (Adult)  Prevent and manage potential problems including:  1. cardiovascular structural defects  2. chest pain (angina)  3. dysrhythmia/arrhythmia  4. embolism  5. heart failure/shock  6. ischemia leading to infarction  7. pericarditis  8. situational response  Goal: Signs and Symptoms of Listed Potential Problems Will be Absent, Minimized or Managed (Cardiac: ACS)  Signs and symptoms of listed potential problems will be absent, minimized or managed by discharge/transition of care (reference Cardiac: ACS (Acute Coronary Syndrome) (Adult) CPG).  Outcome: Ongoing (see interventions/notes)

## 2016-12-18 NOTE — Care Plan (Signed)
Problem: Fall Risk (Adult)  Goal: Identify Related Risk Factors and Signs and Symptoms  Related risk factors and signs and symptoms are identified upon initiation of Human Response Clinical Practice Guideline (CPG).   Outcome: Completed Date Met: 12/18/16    Goal: Absence of Falls  Patient will demonstrate the desired outcomes by discharge/transition of care.   Outcome: Ongoing (see interventions/notes)      Problem: Cardiac: ACS (Acute Coronary Syndrome) (Adult)  Prevent and manage potential problems including:  1. cardiovascular structural defects  2. chest pain (angina)  3. dysrhythmia/arrhythmia  4. embolism  5. heart failure/shock  6. ischemia leading to infarction  7. pericarditis  8. situational response   Goal: Signs and Symptoms of Listed Potential Problems Will be Absent, Minimized or Managed (Cardiac: ACS)  Signs and symptoms of listed potential problems will be absent, minimized or managed by discharge/transition of care (reference Cardiac: ACS (Acute Coronary Syndrome) (Adult) CPG).   Outcome: Completed Date Met: 12/18/16

## 2016-12-18 NOTE — Nurses Notes (Addendum)
Patient discharged to home with granddaughter escorting.  Discharge summary detailed, handouts given; wearing hearing aids, dentures and glasses.  Patient understands follow up, limitations and discharge medications.  All assistive devices accounted for.  Anticipate ambulation to 10th floor elevator when ride arrives.      1718: pt ambulated with grandaughter to elevators.

## 2016-12-18 NOTE — Progress Notes (Signed)
[facility]  Discharge Day Note    [name] AcheNancy [name]  Date of service: 12/18/2016  Date of Admission:  12/17/2016  Hospital Day:  LOS: 0 days     Pt admitted for chest pain, r/o for ACS, underwent stress earlier today showing no evidence of ischemia.    Pt did well overnight denies any chest pain/pressure.  Denies any cough, SOB, DOE or orthopnea.  Denies any lightheadedness or dizziness.  Denies any palpitations or fluttering.  Pt anxious to be d/c to home.    Examination at discharge:  Vital Signs:  Temperature: 37.2 C (99 F) (12/18/16 1140)  Heart Rate: 69 (12/18/16 1140)  BP (Non-Invasive): 125/84 (12/18/16 1140)  Respiratory Rate: 18 (12/18/16 1300)  SpO2-1: 98 % (12/18/16 1140)  Pain Score (Numeric, Faces): 0 (12/18/16 1140)  General: Appears in good health and appears stated age  Eyes: Conjunctiva clear.  HENT:Mouth mucous membranes dry.   Neck: No JVD, normal inspection of neck.  Lungs: clear to auscultation bilaterally.   Cardiovascular:    Heart regular rate and rhythm, no significant murmur heard.    Vascular No carotid bruits.    Chest wall Normal & non tender to palpation.  Abdomen: Soft, non-tender  Extremities: No cyanosis or pitting pretibial edema  Skin: No rashes  Neurologic: Grossly normal without focal deficit   Psychiatric: AOx3 and affect normal & congruent      Assessment/ Plan:     1.  Chest pain in light of CAD  -Patient troponin, EKG, and chest X-Ray are WNL  -TTE today  -Underwent MPS today with no evidence of ischemia  -Continue ASA, Plavix, Toprol, and Entresto  -Will plan on d/c to home with f/u with Dr. [name] next week    2.  Hypertension  -Patient BP well controlled on admission  -Continue current home antihypertensive medications    3.  Hyperlipidemia  -LDL 161  -Patient on maximum dose statin  -Consider referral to lipid clinic to get patient to a goal LDL    4.  HFpEF  -Patient EF improved to 55%  -Patient euvolemic on exam  -Continue Lasix, Entresto, and Beta  Blocker    5.  Viral Illness  -Recent vomiting and diarrhea  -Creatinine slightly elevated  -Will encourage fluids  -Re-evaluate BMP in am    6.  Hypothyroidism  -TSH 4.885  -Continue Levothyroxine    Pt will be d/c to home today, patient underwent MPS with no evidence of ischemia or infarct.  Will have close f/u [name]      See dictated d/c summary.    Disposition : Home discharge    Lay Caregiver Name: Mateo Flowhoebe Mollard, Lay Caregiver Contact Number: [phone], Lay Caregiver Relationship to patient: child      Curlene DolphinStephen Gnegy, PA-C    I personally saw and evaluated the patient on 12/18/2016. See mid-level's note for additional details. My findings/participation are similar to those documented above by Mr. Ambrose FinlandGnegy.  The myocardial perfusion study was negative for ischemia and infarction.  She will be discharged to home today with plans to follow up with Dr. [name].     Ephriam KnucklesJames D Vrinda Heckstall, MD

## 2016-12-20 ENCOUNTER — Ambulatory Visit (INDEPENDENT_AMBULATORY_CARE_PROVIDER_SITE_OTHER): Payer: MEDICARE | Admitting: Internal Medicine

## 2016-12-20 ENCOUNTER — Encounter (INDEPENDENT_AMBULATORY_CARE_PROVIDER_SITE_OTHER): Payer: Self-pay | Admitting: Internal Medicine

## 2016-12-20 ENCOUNTER — Other Ambulatory Visit (INDEPENDENT_AMBULATORY_CARE_PROVIDER_SITE_OTHER): Payer: Self-pay | Admitting: Internal Medicine

## 2016-12-20 VITALS — BP 134/93 | HR 70 | Ht 62.0 in | Wt 157.6 lb

## 2016-12-20 DIAGNOSIS — I2510 Atherosclerotic heart disease of native coronary artery without angina pectoris: Secondary | ICD-10-CM

## 2016-12-20 LAB — MRSA COLONIZATION SCREEN, PCR: MRSA COLONIZATION SCREEN: NEGATIVE

## 2016-12-20 MED ORDER — ASPIRIN 81 MG CHEWABLE TABLET
81.0000 mg | CHEWABLE_TABLET | Freq: Every day | ORAL | 4 refills | Status: AC
Start: 2016-12-20 — End: ?

## 2016-12-20 MED ORDER — CLOPIDOGREL 75 MG TABLET
75.0000 mg | ORAL_TABLET | Freq: Every day | ORAL | 4 refills | Status: AC
Start: 2016-12-20 — End: ?

## 2016-12-20 MED ORDER — DOCUSATE SODIUM 100 MG CAPSULE
100.0000 mg | ORAL_CAPSULE | Freq: Two times a day (BID) | ORAL | 4 refills | Status: AC | PRN
Start: 2016-12-20 — End: ?

## 2016-12-20 MED ORDER — FUROSEMIDE 20 MG TABLET
20.0000 mg | ORAL_TABLET | Freq: Every day | ORAL | 3 refills | Status: DC
Start: 2016-12-20 — End: 2017-02-28

## 2016-12-20 MED ORDER — METOPROLOL SUCCINATE ER 25 MG TABLET,EXTENDED RELEASE 24 HR
25.0000 mg | ORAL_TABLET | Freq: Every day | ORAL | 4 refills | Status: AC
Start: 2016-12-20 — End: ?

## 2016-12-20 MED ORDER — NITROGLYCERIN 0.4 MG SUBLINGUAL TABLET
0.4000 mg | SUBLINGUAL_TABLET | SUBLINGUAL | 5 refills | Status: AC | PRN
Start: 2016-12-20 — End: ?

## 2016-12-20 MED ORDER — ATORVASTATIN 80 MG TABLET
80.0000 mg | ORAL_TABLET | Freq: Every evening | ORAL | 4 refills | Status: AC
Start: 2016-12-20 — End: ?

## 2016-12-20 NOTE — Progress Notes (Signed)
See dictated note.  JL

## 2016-12-21 NOTE — Progress Notes (Signed)
PATIENT NAME: [name], [name]   HOSPITAL NUMBER:  [id number]  DATE OF SERVICE: 12/20/2016  DATE OF BIRTH:  1933/11/22    PROGRESS NOTE    PRIMARY CARE PROVIDER:  Lona MillardKenneth W Hawk, MSN, CRNP.    REASON FOR VISIT:  Followup after recent hospitalization.    SUBJECTIVE:  [name] Acheancy Shutes is an 83 year old female patient of mine with well-established coronary artery disease having undergone a complex PCI back in 2017 to the LAD and diagonal coronary arteries.  She also at that time had ischemic cardiomyopathy.  She has done well on medical therapy including Entresto, Toprol-XL and Lasix.  She reports over the last 1 week, she had increased shortness of breath and cough as well as some chest tightness at rest, relieved with nitroglycerin.  This prompted an Emergency Department visit and eventual [facility] where she underwent further evaluation with a stress myocardial perfusion scan, which was unremarkable without evidence of stress-induced perfusion defect, and an echocardiogram, which revealed a normal left ventricular function without significant valvular heart disease.  She reports she is back to her baseline.  Of note, she reports she had reduced her home Lasix dose from 20 mg daily to every other day on her own because it was causing dry eyes.  She has returned back to her daily dosing and is now asymptomatic and can lie flat without shortness of breath.  Otherwise, no new issues.    CURRENT MEDICATIONS:  Current Outpatient Prescriptions   Medication Sig    allopurinol (ZYLOPRIM) 100 mg Oral Tablet Take 100 mg by mouth Once a day    aspirin 81 mg Oral Tablet, Chewable Take 1 Tab (81 mg total) by mouth Once a day    atorvastatin (LIPITOR) 80 mg Oral Tablet Take 1 Tab (80 mg total) by mouth Every evening    clopidogrel (PLAVIX) 75 mg Oral Tablet Take 1 Tab (75 mg total) by mouth Once a day    docusate sodium (COLACE) 100 mg Oral Capsule Take 1 Cap (100 mg total) by mouth Twice per  day as needed for Constipation    Fluocinolone-Shower Cap (DERMA-SMOOTHE/FS SCALP OIL) 0.01 % Oil 1 Applicator by Scalp route Every Saturday    Fluocinonide (LIDEX) 0.05 % Solution Apply topically Twice daily    FOLIC ACID/MULTIVIT-MIN/LUTEIN (CENTRUM SILVER ORAL) Take by mouth    furosemide (LASIX) 20 mg Oral Tablet Take 1 Tab (20 mg total) by mouth Once a day    levothyroxine (SYNTHROID) 50 mcg Oral Tablet Take 50 mcg by mouth Every morning    metoprolol succinate (TOPROL-XL) 25 mg Oral Tablet Sustained Release 24 hr Take 1 Tab (25 mg total) by mouth Once a day    Metronidazole (METROCREAM) 0.75 % Cream Apply topically Twice daily    nitroGLYCERIN (NITROSTAT) 0.4 mg Sublingual Tablet, Sublingual 1 Tab (0.4 mg total) by Sublingual route Every 5 minutes as needed for Chest pain for 3 doses over 15 minutes    sacubitril-valsartan (ENTRESTO) 24-26 mg Oral Tablet Take 1 Tab by mouth Twice daily         OBJECTIVE:  On exam, she is a well-appearing 83 year old female who appears stated age, in no acute distress.  Vital signs are stable.  She is 5 feet 2 inches tall, weighs 157 pounds, heart rate is 70 beats per minute, blood pressure 134/93, oxygen saturation 99% on room air.  Head is normocephalic, atraumatic.  Neck is supple without JVD, carotid bruit or thyromegaly.  Heart is regular  rate and rhythm.  No murmurs, gallops or rubs.  Lungs are clear to auscultation bilaterally.  Abdomen is soft, nontender, nondistended with normoactive bowel sounds.  Extremities reveal no peripheral edema.  She is alert and oriented x3.  Neurologic exam is grossly normal.    ASSESSMENT AND PLAN:  [name] Acheancy Wilms is an 83 year old female who presents for followup.  1. Coronary artery disease.  At this point in time, she is presently asymptomatic and stable.  Continue aspirin, Plavix, statin and beta-blocker therapy and follow up in 1 month.  Of note, she recently underwent a stress myocardial perfusion scan December 2018 which was  unremarkable.  2. Ischemic cardiomyopathy.  I suspect that she had some decompensation of her heart failure with preserved ejection fraction following reducing her home Lasix, which she will continue Lasix daily as well as Entresto and Toprol-XL and follow up here in 1 month.  3. Hyperlipidemia.  Continue Lipitor 80 mg daily given her known coronary artery disease.  4. Hypertension.  Blood pressure is well controlled.  Continue the above-listed medication unchanged.  Goal blood pressure is less than 130/80.     I spent 15/25 minutes counseling her regarding recent hospitalization, testing results, coronary artery disease and cardiomyopathy management.        Lucilla EdinJonathan A Akia Desroches, MD  Assistant Professor, Section of Cardiology   [facility]            CC:   Lona MillardKenneth W Hawk, MSN, CRNP   [address]   [facility]   [place], [place] [id number]       DD:  12/20/2016 10:32:47  DT:  12/21/2016 01:19:26 MK  D#:  [id number]

## 2017-01-28 ENCOUNTER — Ambulatory Visit (INDEPENDENT_AMBULATORY_CARE_PROVIDER_SITE_OTHER): Payer: MEDICARE | Admitting: Internal Medicine

## 2017-01-28 ENCOUNTER — Encounter (INDEPENDENT_AMBULATORY_CARE_PROVIDER_SITE_OTHER): Payer: Self-pay | Admitting: Internal Medicine

## 2017-01-28 VITALS — BP 152/86 | HR 64 | Ht 62.0 in | Wt 160.3 lb

## 2017-01-28 DIAGNOSIS — I2510 Atherosclerotic heart disease of native coronary artery without angina pectoris: Secondary | ICD-10-CM

## 2017-01-28 NOTE — Progress Notes (Signed)
See dictated note.  JL

## 2017-01-29 NOTE — Progress Notes (Signed)
PATIENT NAME: [name]   HOSPITAL NUMBER:  [id number]  DATE OF SERVICE: 01/28/2017  DATE OF BIRTH:  11-Mar-1933    PROGRESS NOTE    PRIMARY CARE PROVIDER:  Lona Millard, MSN, CRNP.    REASON FOR VISIT:  Routine followup.    SUBJECTIVE:  [name] is an 84 year old female who presents today for 1 month followup.  She was last seen in December 2018 following an episode of chest pain.  She had a negative nuclear stress test during an admission to [facility].  At that time, she was placed back on her Lasix and actually had improvement of her symptoms.  She felt like her symptoms were due to stopping her Lasix on her own accord.  She has a history of PCI to the LAD and diagonal coronary arteries back in 2017 as well as a resultant ischemic cardiomyopathy.  She continues to do well on medical therapy including Entresto, Toprol-XL and Lasix.  She has no new issues today.  She is feeling well.  She is back in cardiac rehab and has no new issues overall.    CURRENT MEDICATIONS:  Current Outpatient Medications   Medication Sig   . allopurinol (ZYLOPRIM) 100 mg Oral Tablet Take 100 mg by mouth Once a day   . aspirin 81 mg Oral Tablet, Chewable Take 1 Tab (81 mg total) by mouth Once a day   . atorvastatin (LIPITOR) 80 mg Oral Tablet Take 1 Tab (80 mg total) by mouth Every evening   . clopidogrel (PLAVIX) 75 mg Oral Tablet Take 1 Tab (75 mg total) by mouth Once a day   . docusate sodium (COLACE) 100 mg Oral Capsule Take 1 Cap (100 mg total) by mouth Twice per day as needed for Constipation   . Fluocinolone-Shower Cap (DERMA-SMOOTHE/FS SCALP OIL) 0.01 % Oil 1 Applicator by Scalp route Every Saturday   . Fluocinonide (LIDEX) 0.05 % Solution Apply topically Twice daily   . FOLIC ACID/MULTIVIT-MIN/LUTEIN (CENTRUM SILVER ORAL) Take by mouth   . furosemide (LASIX) 20 mg Oral Tablet Take 1 Tab (20 mg total) by mouth Once a day   . levothyroxine (SYNTHROID) 50 mcg Oral Tablet Take 50 mcg by mouth  Every morning   . metoprolol succinate (TOPROL-XL) 25 mg Oral Tablet Sustained Release 24 hr Take 1 Tab (25 mg total) by mouth Once a day   . Metronidazole (METROCREAM) 0.75 % Cream Apply topically Twice daily   . nitroGLYCERIN (NITROSTAT) 0.4 mg Sublingual Tablet, Sublingual 1 Tab (0.4 mg total) by Sublingual route Every 5 minutes as needed for Chest pain for 3 doses over 15 minutes   . sacubitril-valsartan (ENTRESTO) 24-26 mg Oral Tablet Take 1 Tab by mouth Twice daily         OBJECTIVE:  On exam, she is a well-appearing 84 year old female who appears stated age in no acute distress.  Vital signs are stable.  She is 5 feet 2 inches tall, weighs 160 pounds.  Heart rate is 64 beats per minute, blood pressure 152/86, recheck is 130/80, oxygen saturation 98% on room air.  Head is normocephalic, atraumatic.  Neck is supple without JVD, carotid bruit, or thyromegaly.  Heart is regular rate and rhythm.  No murmurs, gallops, or rubs.  Lungs are clear to auscultation bilaterally.  Abdomen is soft, nontender, nondistended with normoactive bowel sounds.  Extremities reveal no peripheral edema.  She is alert and oriented x3.  Neurologic exam is grossly normal.    ASSESSMENT AND  PLAN:  [name] is an 84 year old female who presents for followup.  1. Coronary artery disease.  At this point in time, she is presently asymptomatic and stable.  She had a recent negative stress myocardial perfusion scan back in December 2018.  She will continue her aspirin, Plavix, statin and beta-blocker therapy and follow up in 6 months.  2. Ischemic cardiomyopathy.  I suspect that she previously had some decompensation from a heart failure perspective following reducing her home Lasix.  Since starting back on Lasix, her symptoms are very well controlled.  She also continues Entresto and Toprol-XL and follow up with me in 6 months.  She is clinically euvolemic.  3. Hyperlipidemia.  Continue Lipitor 80 mg daily given her known coronary artery  disease.  Check yearly lipid panels.  4. Hypertension.  Blood pressure is well controlled.  Continue above-listed medication unchanged.  Goal blood pressure is less than 130/80.     I spent 15/25 minutes counseling her regarding coronary artery disease, chest pain and cardiomyopathy.        Lucilla EdinJonathan A Kyelle Urbas, MD  Assistant Professor, Section of Cardiology   [place] [facility]            CC:   [name] MillardKenneth W Hawk, MSN, CRNP   [address]   [facility]   [place], [place] [id number]       DD:  01/28/2017 11:21:04  DT:  01/29/2017 01:29:46 MK  D#:  [id number]

## 2017-02-28 ENCOUNTER — Other Ambulatory Visit (INDEPENDENT_AMBULATORY_CARE_PROVIDER_SITE_OTHER): Payer: Self-pay | Admitting: Internal Medicine

## 2017-02-28 MED ORDER — FUROSEMIDE 20 MG TABLET
20.0000 mg | ORAL_TABLET | Freq: Every day | ORAL | 3 refills | Status: AC
Start: 2017-02-28 — End: ?

## 2017-03-15 ENCOUNTER — Encounter (HOSPITAL_BASED_OUTPATIENT_CLINIC_OR_DEPARTMENT_OTHER): Payer: Self-pay | Admitting: DERMATOLOGY

## 2017-04-01 ENCOUNTER — Other Ambulatory Visit (INDEPENDENT_AMBULATORY_CARE_PROVIDER_SITE_OTHER): Payer: Self-pay | Admitting: Family

## 2017-04-01 MED ORDER — ALLOPURINOL 100 MG TABLET
100.0000 mg | ORAL_TABLET | Freq: Every day | ORAL | 2 refills | Status: DC
Start: 2017-04-01 — End: 2018-05-08

## 2017-04-12 ENCOUNTER — Encounter (HOSPITAL_BASED_OUTPATIENT_CLINIC_OR_DEPARTMENT_OTHER): Payer: Self-pay | Admitting: DERMATOLOGY

## 2017-05-11 ENCOUNTER — Encounter (INDEPENDENT_AMBULATORY_CARE_PROVIDER_SITE_OTHER): Payer: Self-pay | Admitting: Internal Medicine

## 2017-07-15 ENCOUNTER — Other Ambulatory Visit (INDEPENDENT_AMBULATORY_CARE_PROVIDER_SITE_OTHER): Payer: Self-pay | Admitting: Family

## 2017-08-03 ENCOUNTER — Encounter (INDEPENDENT_AMBULATORY_CARE_PROVIDER_SITE_OTHER): Payer: Self-pay | Admitting: Internal Medicine

## 2018-01-01 ENCOUNTER — Other Ambulatory Visit (INDEPENDENT_AMBULATORY_CARE_PROVIDER_SITE_OTHER): Payer: Self-pay | Admitting: Family

## 2018-01-02 NOTE — Telephone Encounter (Signed)
Patient is due for an appointment prior to further refills. The patient is due for labs prior to further refills.  Earline MayotteMelissa Trayvion Embleton, MA  01/02/2018, 07:30

## 2018-02-24 ENCOUNTER — Encounter (INDEPENDENT_AMBULATORY_CARE_PROVIDER_SITE_OTHER): Payer: Self-pay

## 2018-02-24 ENCOUNTER — Other Ambulatory Visit (INDEPENDENT_AMBULATORY_CARE_PROVIDER_SITE_OTHER): Payer: Self-pay | Admitting: Internal Medicine

## 2018-04-08 ENCOUNTER — Other Ambulatory Visit (INDEPENDENT_AMBULATORY_CARE_PROVIDER_SITE_OTHER): Payer: Self-pay | Admitting: Internal Medicine

## 2018-04-20 ENCOUNTER — Other Ambulatory Visit (INDEPENDENT_AMBULATORY_CARE_PROVIDER_SITE_OTHER): Payer: Self-pay | Admitting: Family

## 2018-05-08 ENCOUNTER — Other Ambulatory Visit (INDEPENDENT_AMBULATORY_CARE_PROVIDER_SITE_OTHER): Payer: Self-pay | Admitting: Family

## 2018-05-08 MED ORDER — ALLOPURINOL 100 MG TABLET
100.0000 mg | ORAL_TABLET | Freq: Every day | ORAL | 2 refills | Status: AC
Start: 2018-05-08 — End: ?

## 2018-10-07 DIAGNOSIS — R079 Chest pain, unspecified: Secondary | ICD-10-CM

## 2018-10-07 DIAGNOSIS — I440 Atrioventricular block, first degree: Secondary | ICD-10-CM

## 2018-10-07 DIAGNOSIS — I498 Other specified cardiac arrhythmias: Secondary | ICD-10-CM

## 2019-04-08 ENCOUNTER — Other Ambulatory Visit (INDEPENDENT_AMBULATORY_CARE_PROVIDER_SITE_OTHER): Payer: Self-pay | Admitting: Family

## 2019-04-10 NOTE — Telephone Encounter (Signed)
Patient is living in [place] and Daughter states she should not be having any scripts filled by this office.   [name]  04/10/2019, 15:17

## 2022-12-21 IMAGING — MR MRI LEFT SHOULDER WITHOUT CONTRAST
4 of 6 series · 23 of 40 positions shown · IV contrast (gadolinium)
Comparison: 12/01/2022 [facility] radiographs

Referring: SANCHEZ, LING                    
________________________________________________________________________________________________ 
MRI LEFT SHOULDER WITHOUT CONTRAST, 12/21/2022 [DATE]: 
CLINICAL INDICATION: Sprain of left rotator cuff capsule, initial encounter
TECHNIQUE: Multiplanar, multiecho position MR images of the shoulder were 
performed without intravenous gadolinium enhancement. Patient was scanned on a 
3T magnet.

[Series 201: survey_left · axial · 10.0mm · 0.99mm/px · z∈[-40,+175]mm · 5 of 15 slices shown]
[im 1/15]
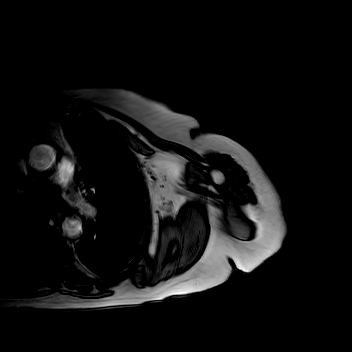
[im 4/15]
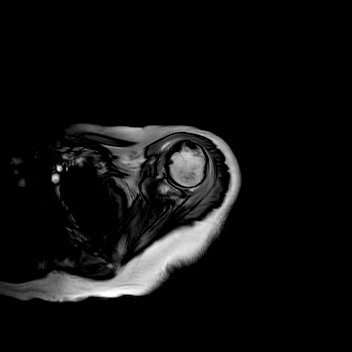
[im 8/15]
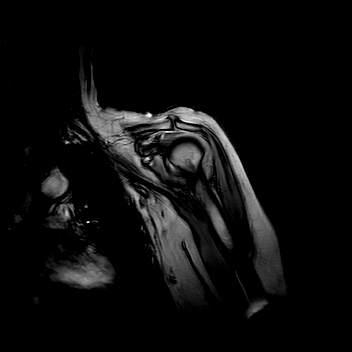
[im 11/15]
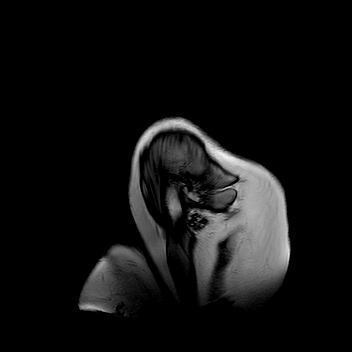
[im 15/15]
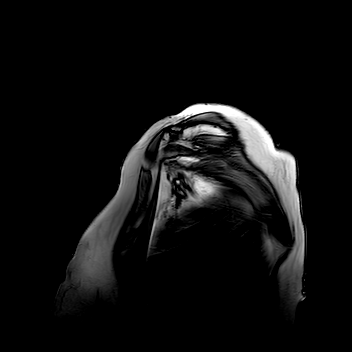

[Series 301: pdw spair left · axial · 2.5mm · 0.27mm/px · z∈[-57,+37]mm · 9 of 36 slices shown]
[im 1/36]
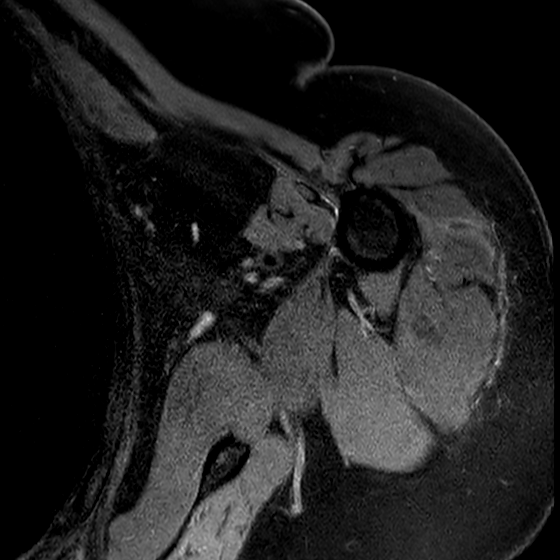
[im 5/36]
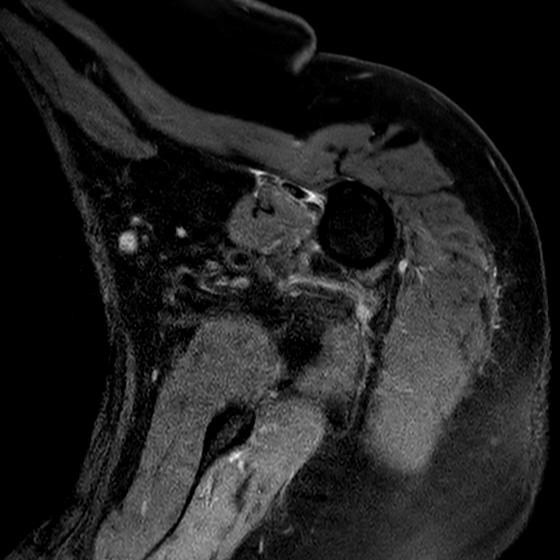
[im 9/36]
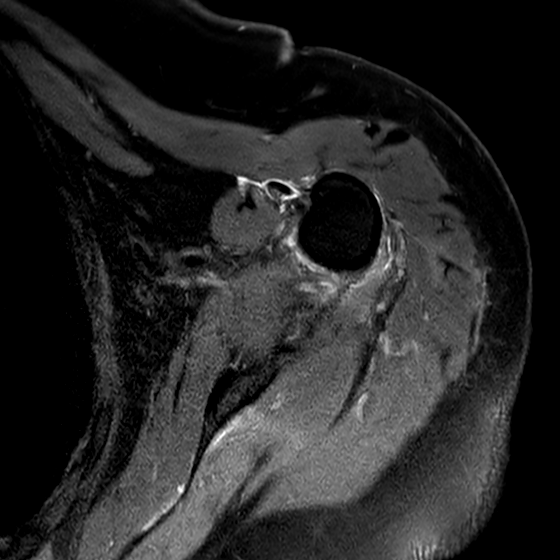
[im 14/36]
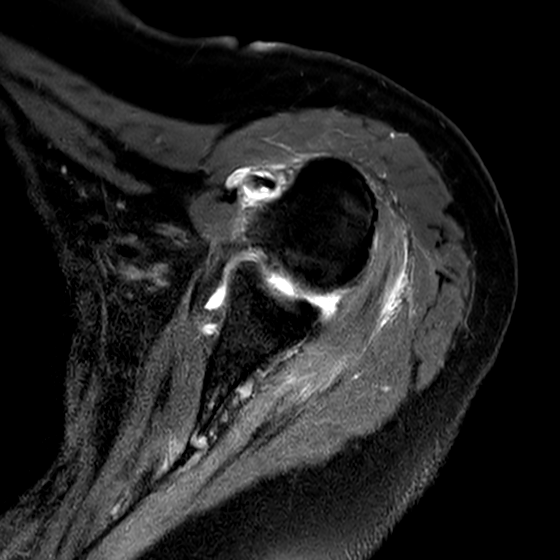
[im 18/36]
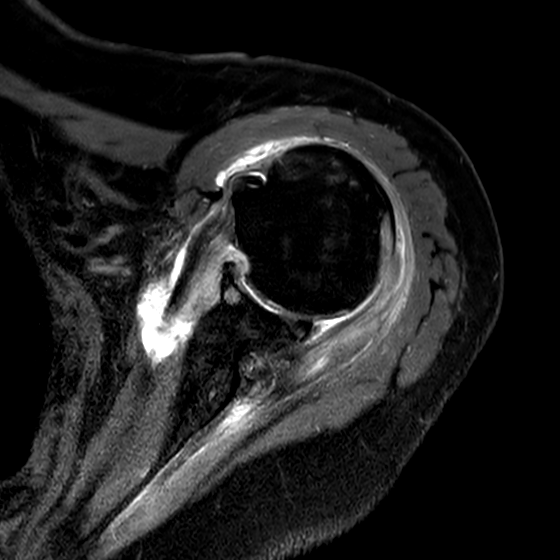
[im 22/36]
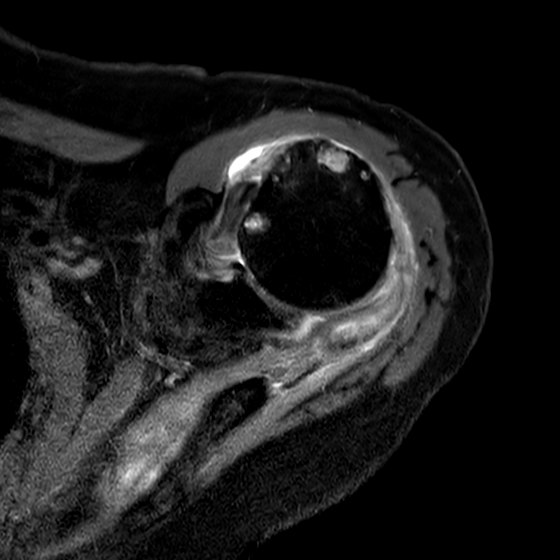
[im 27/36]
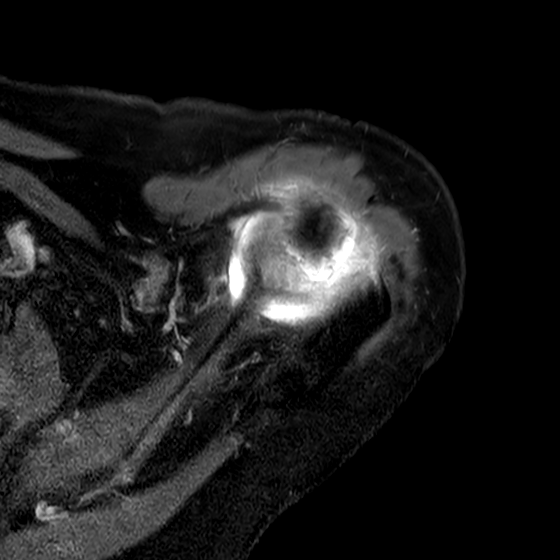
[im 31/36]
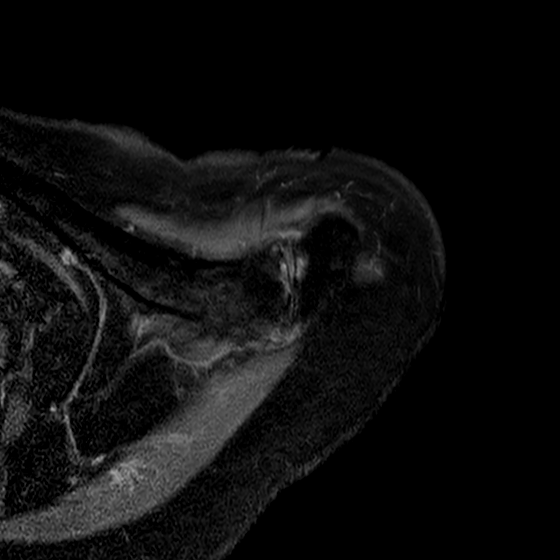
[im 36/36]
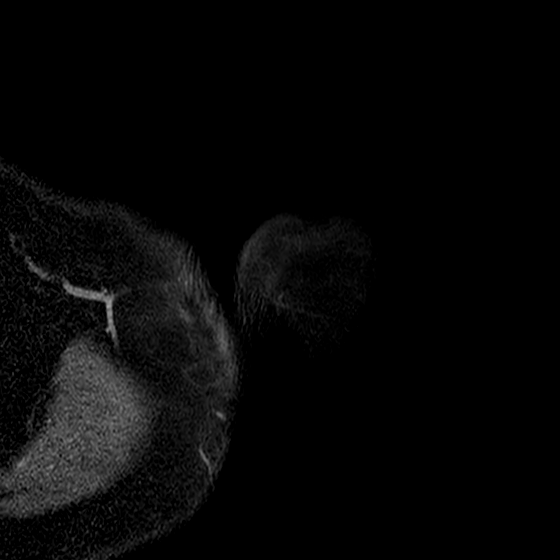

[Series 401: t2w spair sag · oblique · 3.2mm · 0.38mm/px · 6 of 28 slices shown]
[im 1/28]
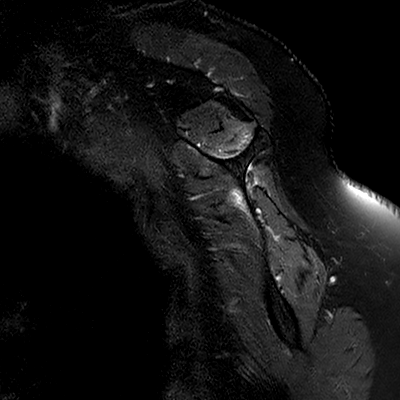
[im 5/28]
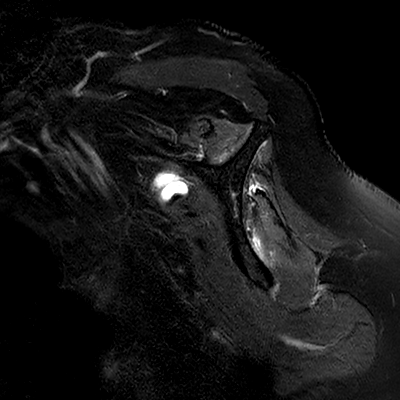
[im 10/28]
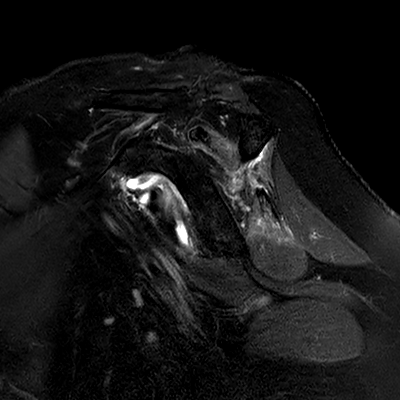
[im 14/28]
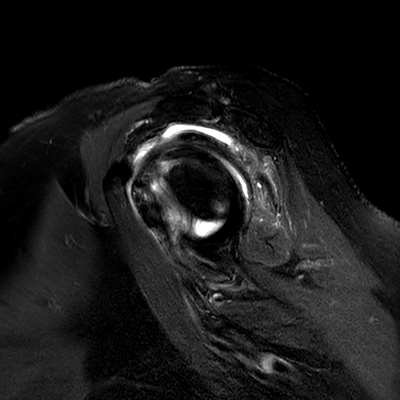
[im 19/28]
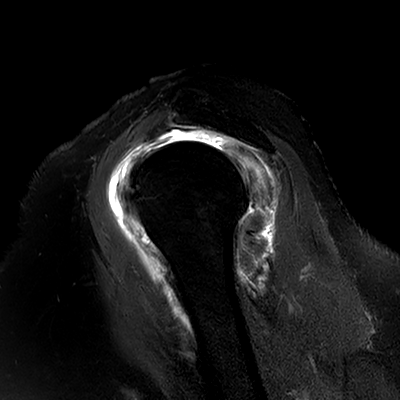
[im 23/28]
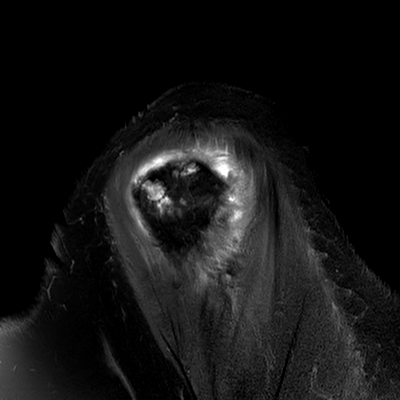

[Series 501: t2w spair cor · oblique · 2.5mm · 0.38mm/px · 3 of 24 slices shown]
[im 5/24]
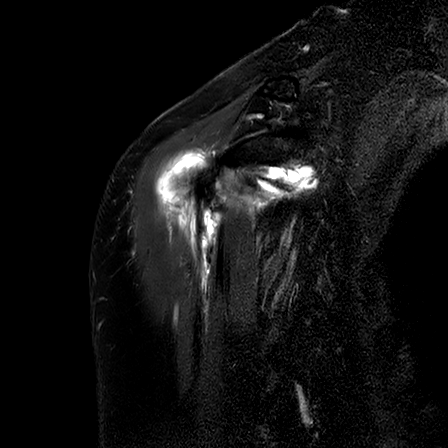
[im 14/24]
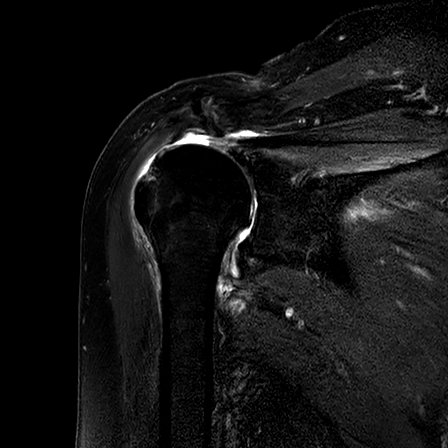
[im 24/24]
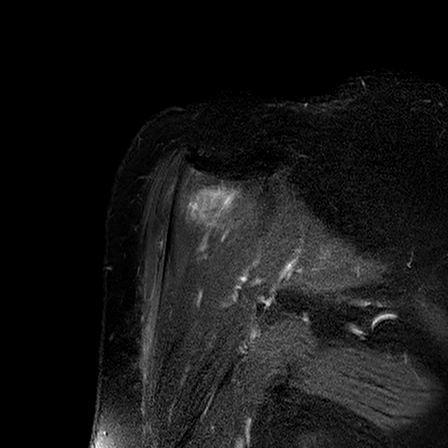

[23 of 40 positions shown; findings below may reference images not displayed]

FINDINGS: ROTATOR CUFF: Full-thickness tear of the distal supraspinatus and infraspinatus 
tendons measures 4.1 cm in AP dimensions with maximal tendinous retraction to 
the level of the superior humeral head. The subscapularis and teres minor 
tendons are intact. Moderate supraspinatus and infraspinatus fatty muscular 
atrophy and mild edema. 
ACROMIOCLAVICULAR JOINT: Mild degenerative change of the acromioclavicular 
joint. The coracoacromial ligament is intact without prominent spurring at the 
acromial attachment. The acromioclavicular and coracoclavicular ligaments are 
preserved. The acromium is normal in morphology. 
GLENOHUMERAL JOINT: The humeral head is well located within the glenoid fossa. 
Moderate degenerative change with full-thickness and partial thickness 
chondromalacia, small subchondral cysts and multifocal degenerative labral 
tears. No paralabral cyst. Biceps tendinosis. Small joint effusion with 
extension into the subacromial/subdeltoid bursa. 
BONES: The bone marrow signal intensity is negative for fracture. No Hill-Sachs 
defect. Subcortical cystic change of the humeral head. 
ADDITIONAL FINDINGS: The axillary region is negative. Subcutaneous tissues are 
negative.
IMPRESSION: 1.  4.1 cm full-thickness tear of the distal supraspinatus/infraspinatus 
tendons, moderate fatty muscular atrophy and mild muscular edema. 
2.  Biceps tendinosis.  
3.  Moderate glenohumeral joint degenerative change, labral tears and small 
joint effusion. 
4.  Mild AC joint degenerative change without mass effect upon the 
supraspinatus.

## 2023-03-28 IMAGING — MR MRI LEFT SHOULDER WITHOUT CONTRAST
5 of 7 series · 25 of 40 positions shown · IV contrast (gadolinium)
Comparison: 12/01/2022 radiographs

________________________________________________________________________________________________ 
MRI LEFT SHOULDER WITHOUT CONTRAST, 03/28/2023 [DATE]: 
CLINICAL INDICATION: Patient fell 3 weeks ago. Left shoulder decreased range of 
motion and pain.
TECHNIQUE: Multiplanar, multiecho position MR images of the shoulder were 
performed without intravenous gadolinium enhancement. Patient was scanned on a 
3T magnet.

[Series 101: survey_fullfov_transversal · axial · 10.0mm · 1.84mm/px · z∈[-82,+82]mm · 2 of 12 slices shown]
[im 1/12]
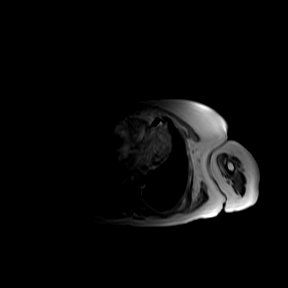
[im 12/12]
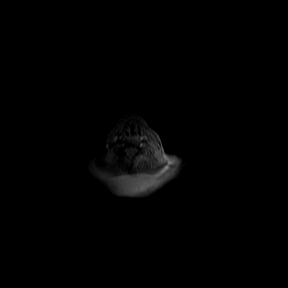

[Series 201: survey_left · axial · 10.0mm · 0.99mm/px · z∈[-40,+175]mm · 4 of 15 slices shown]
[im 1/15]
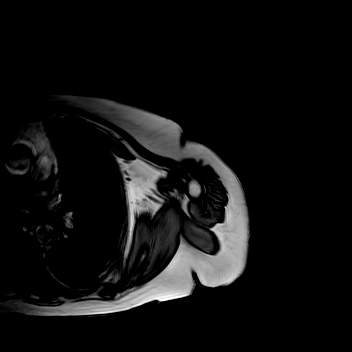
[im 5/15]
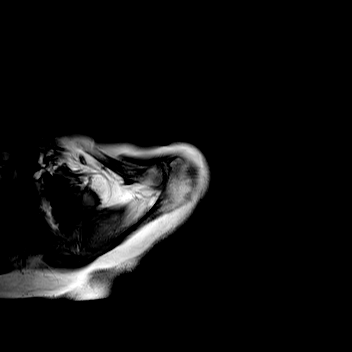
[im 10/15]
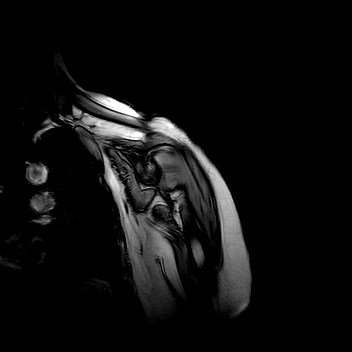
[im 15/15]
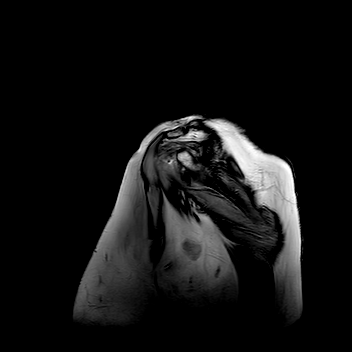

[Series 301: pdw spair left · axial · 2.5mm · 0.27mm/px · z∈[-75,+16]mm · 8 of 36 slices shown]
[im 1/36]
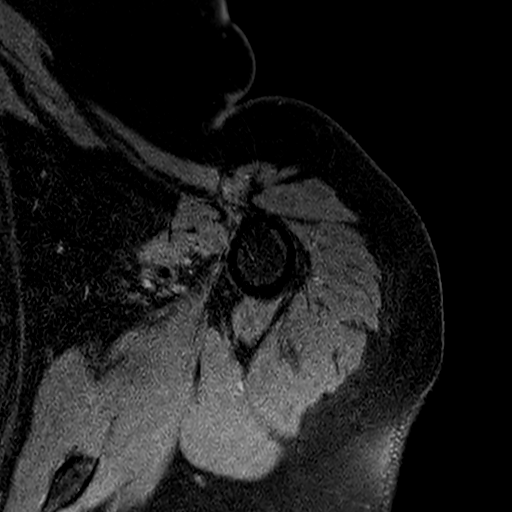
[im 6/36]
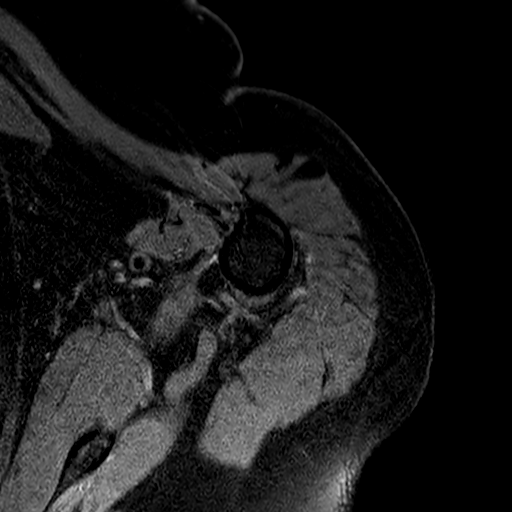
[im 11/36]
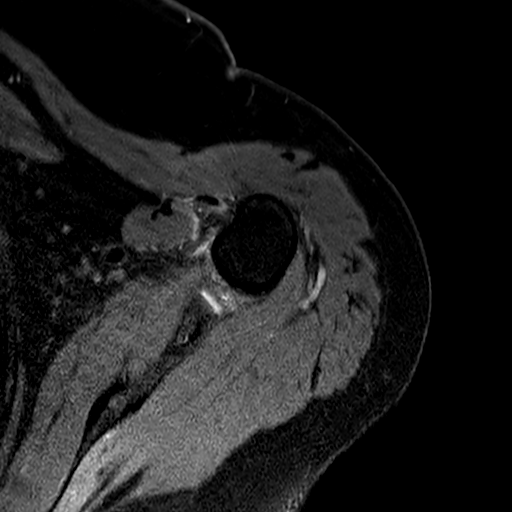
[im 16/36]
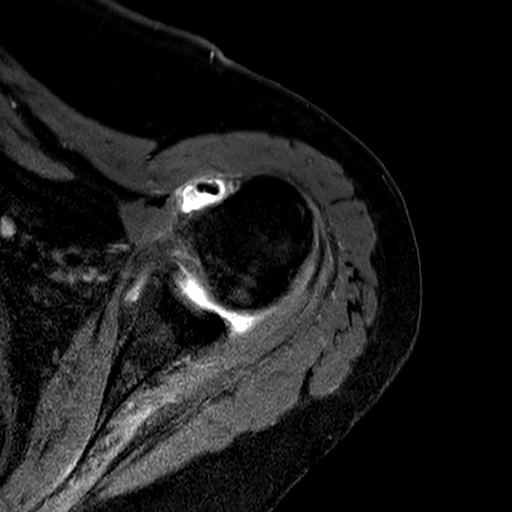
[im 21/36]
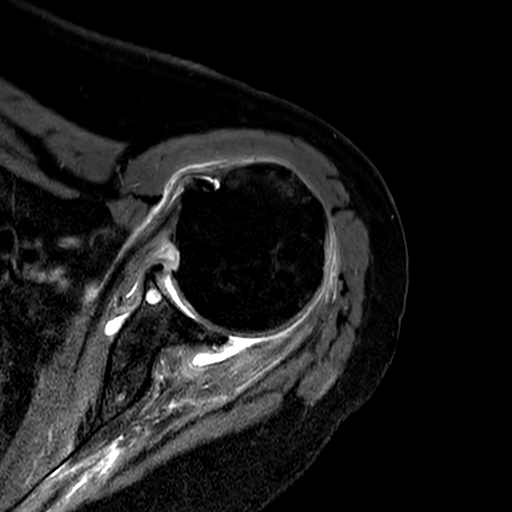
[im 26/36]
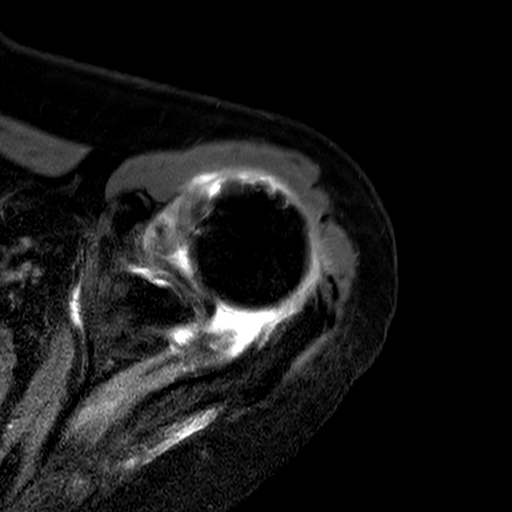
[im 31/36]
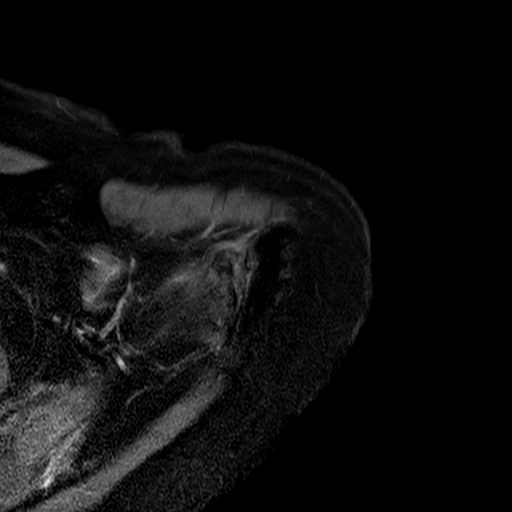
[im 36/36]
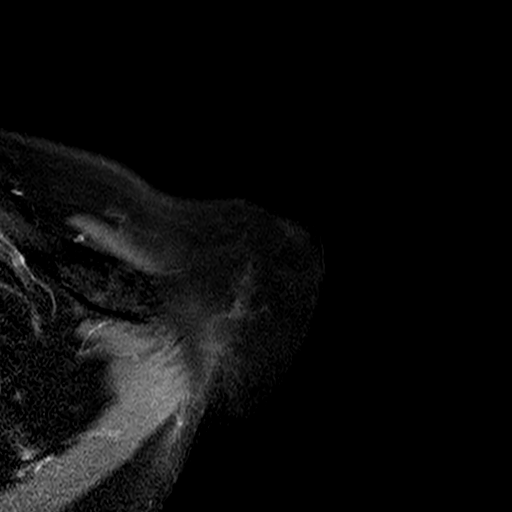

[Series 401: t2w spair sag · oblique · 3.2mm · 0.38mm/px · 7 of 28 slices shown]
[im 1/28]
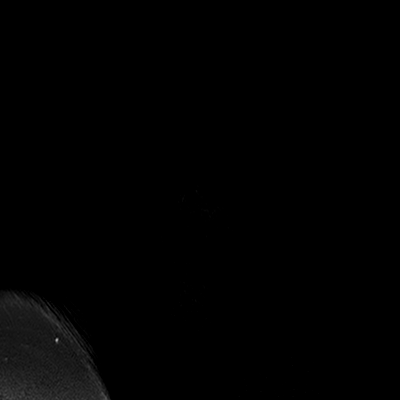
[im 5/28]
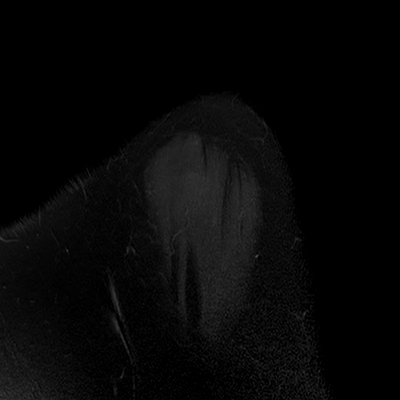
[im 10/28]
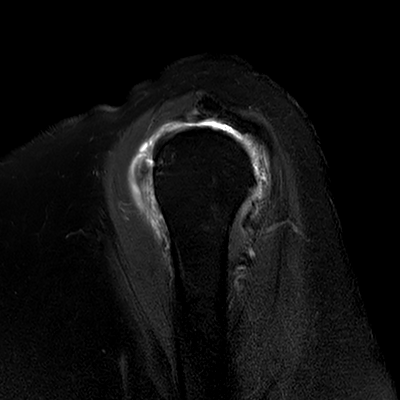
[im 14/28]
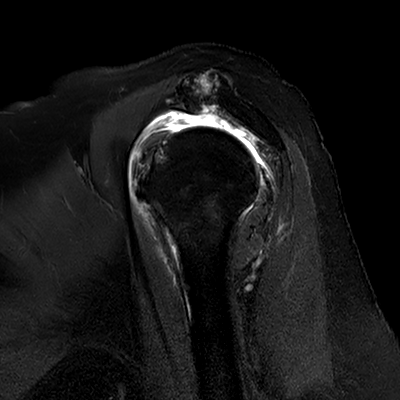
[im 19/28]
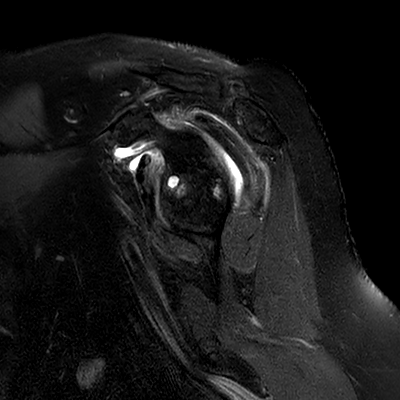
[im 23/28]
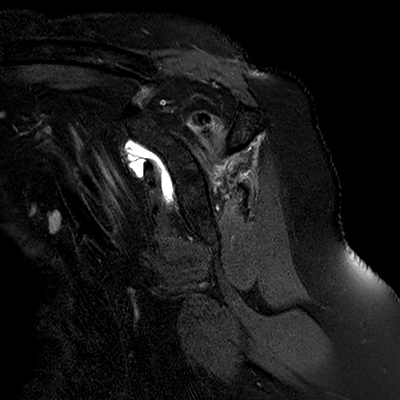
[im 28/28]
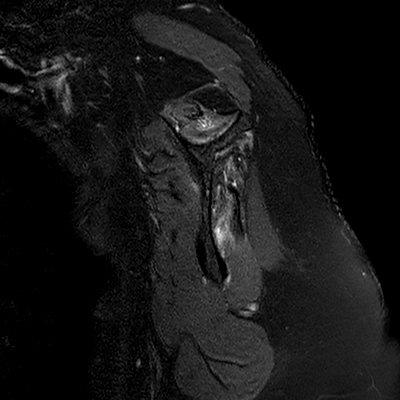

[Series 501: t2w spair cor · oblique · 2.5mm · 0.38mm/px · 4 of 26 slices shown]
[im 1/26]
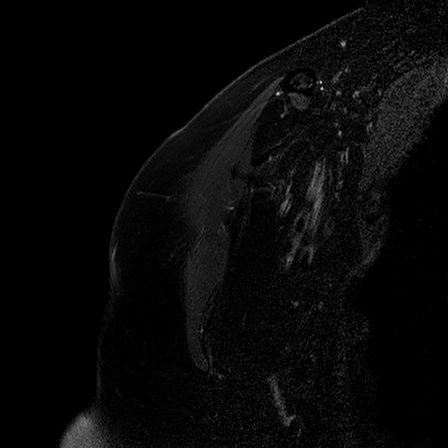
[im 6/26]
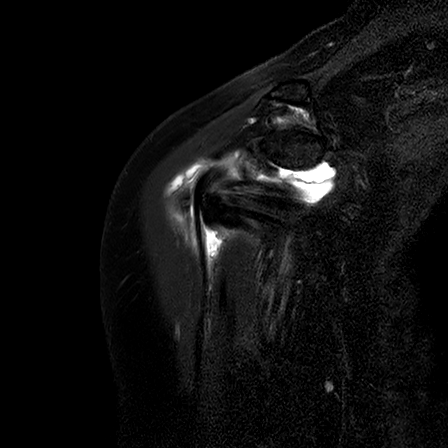
[im 11/26]
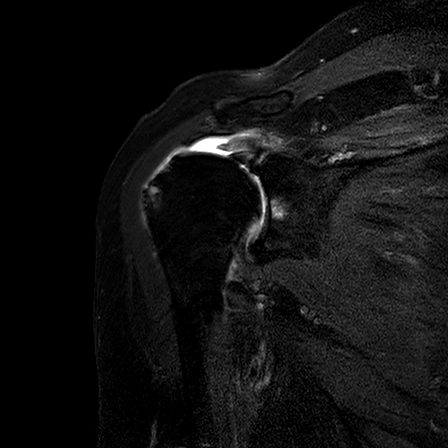
[im 16/26]
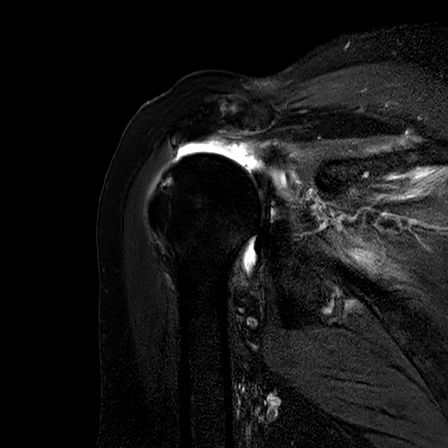

[25 of 40 positions shown; findings below may reference images not displayed]

FINDINGS: ROTATOR CUFF: Full-thickness tear of the distal supraspinatus and infraspinatus 
tendons measures up to 4.9 cm with maximal tendinous retraction to the level of 
the glenohumeral joint. The subscapularis and teres minor tendons are intact. 
Moderate supraspinatus/infraspinatus fatty muscular atrophy. Mild edema in the 
infraspinatus muscle. 
ACROMIOCLAVICULAR JOINT: Mild degenerative change of the acromioclavicular 
joint. The coracoacromial ligament is intact without prominent spurring at the 
acromial attachment. The acromioclavicular and coracoclavicular ligaments are 
preserved. The acromium is normal in morphology. 
GLENOHUMERAL JOINT: Mild posterior humeral head subluxation. Moderate 
glenohumeral joint degenerative change with full-thickness and partial thickness 
chondromalacia, small subcortical cysts and tiny osteophytes. Multifocal 
degenerative labral tears. No paralabral cyst. The intra-articular portion of 
the long head of the biceps tendon is negative. Moderate joint effusion with 
extension into the subacromial/subdeltoid bursa. 
BONES: The bone marrow signal intensity is negative for fracture. No Hill-Sachs 
defect. Subcortical cystic change of the humeral head. 
ADDITIONAL FINDINGS: Two small proximal anterolateral deltoid lipomas (larger 
measure 0.7 x 0.5 x 3.0 cm). The axillary region is negative. Subcutaneous 
tissues are negative.
IMPRESSION: 1.  Large full-thickness tear of the entire distal supraspinatus and 
infraspinatus tendons, moderate fatty muscular atrophy and mild infraspinatus 
muscle edema. 
2.  Moderate glenohumeral joint degenerative change, mild posterior humeral head 
subluxation, degenerative labral tears and moderate joint effusion. 
3.  Mild AC joint degenerative change. 
4.  Two small proximal anterolateral deltoid lipomas.
